# Patient Record
Sex: Female | Born: 1975 | Race: Black or African American | Hispanic: No | Marital: Married | Smoking: Never smoker
Health system: Southern US, Community
[De-identification: ages and names within clinical notes are randomized; demographics above are authoritative.]

## PROBLEM LIST (undated history)

## (undated) DIAGNOSIS — O24419 Gestational diabetes mellitus in pregnancy, unspecified control: Secondary | ICD-10-CM

## (undated) DIAGNOSIS — R51 Headache: Secondary | ICD-10-CM

## (undated) DIAGNOSIS — R519 Headache, unspecified: Secondary | ICD-10-CM

---

## 2014-06-16 ENCOUNTER — Encounter (HOSPITAL_COMMUNITY): Payer: Self-pay | Admitting: *Deleted

## 2014-06-16 ENCOUNTER — Emergency Department (HOSPITAL_COMMUNITY)
Admission: EM | Admit: 2014-06-16 | Discharge: 2014-06-16 | Disposition: A | Discharge status: Home / Self Care | Payer: Medicaid Other | Source: Home / Self Care | Attending: Emergency Medicine | Admitting: Emergency Medicine

## 2014-06-16 DIAGNOSIS — H109 Unspecified conjunctivitis: Secondary | ICD-10-CM | POA: Diagnosis not present

## 2014-06-16 DIAGNOSIS — J069 Acute upper respiratory infection, unspecified: Secondary | ICD-10-CM

## 2014-06-16 DIAGNOSIS — B9789 Other viral agents as the cause of diseases classified elsewhere: Principal | ICD-10-CM

## 2014-06-16 MED ORDER — CETIRIZINE HCL 10 MG PO TABS: 10.0000 mg | ORAL_TABLET | Freq: Every day | ORAL | Status: DC

## 2014-06-16 MED ORDER — ERYTHROMYCIN 5 MG/GM OP OINT: TOPICAL_OINTMENT | OPHTHALMIC | Status: DC

## 2014-06-16 NOTE — Discharge Instructions (Signed)
Congratulations on your pregnancy! You have a cold virus. It is safe to take cetirizine in pregnancy. This will help with the nasal congestion and runny nose. Use the erythromycin eye ointment twice a day in the left eye. This is also safe in pregnancy. Use nasal saline spray as often as needed to help with congestion. If you develop fevers, difficulty breathing, or are just not getting better in the next week, please come back.

## 2014-06-16 NOTE — ED Provider Notes (Signed)
CSN: [id number]     Arrival date & time 06/16/14  1659 History   First MD Initiated Contact with Patient 06/16/14 1803     Chief Complaint  Patient presents with  . Sore Throat  . Conjunctivitis  . Cough   (Consider location/radiation/quality/duration/timing/severity/associated sxs/prior Treatment) HPI  She is a 39 year old woman here for evaluation of cold symptoms. Her symptoms started on Sunday with itching and clear drainage from her left thigh. Her left eye is also red. Then, yesterday she developed nasal congestion, rhinorrhea, sore throat, laryngitis, cough. She denies any fevers. No shortness of breath. No nausea or vomiting. She has not tried any medications. She is [redacted] weeks pregnant. She has an initial OB appointment scheduled for the end of the month.  History reviewed. No pertinent past medical history. Past Surgical History  Procedure Laterality Date  . Cesarean section  2012, 2014     x 2   History reviewed. No pertinent family history. History  Substance Use Topics  . Smoking status: Never Smoker   . Smokeless tobacco: Not on file  . Alcohol Use: No   OB History    Gravida Para Term Preterm AB TAB SAB Ectopic Multiple Living   1              Review of Systems  Constitutional: Negative for fever.  HENT: Positive for congestion, rhinorrhea, sore throat and voice change. Negative for ear pain and trouble swallowing.   Eyes: Positive for discharge, redness and itching.  Respiratory: Positive for cough. Negative for shortness of breath.   Gastrointestinal: Negative for nausea and vomiting.    Allergies  Review of patient's allergies indicates no known allergies.  Home Medications   Prior to Admission medications   Medication Sig Start Date End Date Taking? Authorizing Provider  cetirizine (ZYRTEC) 10 MG tablet Take 1 tablet (10 mg total) by mouth daily. 06/16/14   Charm RingsErin J Dabney Schanz, MD  erythromycin ophthalmic ointment Place a 1/2 inch ribbon of ointment into the  left lower eyelid twice a day. 06/16/14   Charm RingsErin J Eman Rynders, MD   BP 102/73 mmHg  Pulse 87  Temp(Src) 98.1 F (36.7 C) (Oral)  Resp 14  SpO2 99%  LMP 04/06/2014 Physical Exam  Constitutional: She is oriented to person, place, and time. She appears well-developed and well-nourished. No distress.  HENT:  Right Ear: Tympanic membrane normal.  Left Ear: Tympanic membrane normal.  Nose: Mucosal edema and rhinorrhea present.  Mouth/Throat: Posterior oropharyngeal erythema present. No oropharyngeal exudate.  Eyes: Pupils are equal, round, and reactive to light. Right conjunctiva is not injected. Left conjunctiva is injected.  Neck: Neck supple.  Cardiovascular: Normal rate, regular rhythm and normal heart sounds.   No murmur heard. Pulmonary/Chest: Effort normal and breath sounds normal. No respiratory distress. She has no wheezes. She has no rales.  Lymphadenopathy:    She has no cervical adenopathy.  Neurological: She is alert and oriented to person, place, and time.    ED Course  Procedures (including critical care time) Labs Review Labs Reviewed - No data to display  Imaging Review No results found.   MDM   1. Viral URI with cough   2. Conjunctivitis of right eye    Recommended Zyrtec and nasal saline spray to help with congestion and rhinorrhea. Erythromycin eye ointment for conjunctivitis. Follow-up as needed.   Charm RingsErin J Jasmane Brockway, MD 06/16/14 [phone]

## 2014-06-16 NOTE — ED Notes (Addendum)
C/o sore throat onset yesterday with laryngitis and cough.  Pink eye OS onset Sunday.  Cough is prod. of yellow sputum.  No fever. States she is not taking any OTC meds because she is [redacted] weeks pregnant.

## 2014-07-23 LAB — OB RESULTS CONSOLE ABO/RH: RH Type: POSITIVE

## 2014-07-23 LAB — OB RESULTS CONSOLE HGB/HCT, BLOOD
HCT: 36 %
Hemoglobin: 11.6 g/dL

## 2014-07-23 LAB — DRUG SCREEN, URINE: DRUG SCREEN, URINE: NEGATIVE

## 2014-07-23 LAB — OB RESULTS CONSOLE PLATELET COUNT: PLATELETS: 227 10*3/uL

## 2014-07-23 LAB — OB RESULTS CONSOLE HEPATITIS B SURFACE ANTIGEN: Hepatitis B Surface Ag: NEGATIVE

## 2014-07-23 LAB — OB RESULTS CONSOLE RUBELLA ANTIBODY, IGM: Rubella: IMMUNE

## 2014-07-23 LAB — OB RESULTS CONSOLE ANTIBODY SCREEN: Antibody Screen: NEGATIVE

## 2014-07-23 LAB — OB RESULTS CONSOLE VARICELLA ZOSTER ANTIBODY, IGG: VARICELLA IGG: IMMUNE

## 2014-07-23 LAB — CULTURE, OB URINE: Urine Culture, OB: NEGATIVE

## 2014-07-23 LAB — GLUCOSE TOLERANCE, 1 HOUR (50G) W/O FASTING: Glucose 1 Hr Prenatal, POC: 137 mg/dL

## 2014-07-23 LAB — OB RESULTS CONSOLE RPR: RPR: NONREACTIVE

## 2014-07-23 LAB — CYTOLOGY - PAP: CYTOLOGY - PAP: NEGATIVE

## 2014-07-23 LAB — OB RESULTS CONSOLE GC/CHLAMYDIA
Chlamydia: NEGATIVE
GC PROBE AMP, GENITAL: NEGATIVE

## 2014-07-23 LAB — OB RESULTS CONSOLE HIV ANTIBODY (ROUTINE TESTING): HIV: NONREACTIVE

## 2014-07-23 LAB — CYSTIC FIBROSIS DIAGNOSTIC STUDY: Interpretation-CFDNA:: NEGATIVE

## 2014-07-31 ENCOUNTER — Other Ambulatory Visit (HOSPITAL_COMMUNITY): Payer: Self-pay | Admitting: Nurse Practitioner

## 2014-07-31 DIAGNOSIS — Z3689 Encounter for other specified antenatal screening: Secondary | ICD-10-CM

## 2014-07-31 LAB — GLUCOSE, 3 HOUR GESTATIONAL
GLUCOSE 2 HOUR GTT: 145 mg/dL — AB (ref ?–140)
GLUCOSE 3 HOUR GTT: 119 mg/dL (ref ?–140)
Glucose, Fasting: 96 mg/dL (ref 60–109)
Glucose, GTT - 1 Hour: 154 mg/dL (ref ?–200)

## 2014-09-01 ENCOUNTER — Ambulatory Visit (HOSPITAL_COMMUNITY)
Admission: RE | Admit: 2014-09-01 | Discharge: 2014-09-01 | Disposition: A | Discharge status: Home / Self Care | Payer: Medicaid Other | Source: Ambulatory Visit | Attending: Nurse Practitioner | Admitting: Nurse Practitioner

## 2014-09-01 ENCOUNTER — Other Ambulatory Visit (HOSPITAL_COMMUNITY): Payer: Self-pay | Admitting: Nurse Practitioner

## 2014-09-01 ENCOUNTER — Ambulatory Visit (HOSPITAL_COMMUNITY): Admission: RE | Admit: 2014-09-01 | Payer: Medicaid Other | Source: Ambulatory Visit

## 2014-09-01 DIAGNOSIS — O3421 Maternal care for scar from previous cesarean delivery: Secondary | ICD-10-CM | POA: Insufficient documentation

## 2014-09-01 DIAGNOSIS — Z3A19 19 weeks gestation of pregnancy: Secondary | ICD-10-CM | POA: Insufficient documentation

## 2014-09-01 DIAGNOSIS — Z3689 Encounter for other specified antenatal screening: Secondary | ICD-10-CM

## 2014-09-01 DIAGNOSIS — O09529 Supervision of elderly multigravida, unspecified trimester: Secondary | ICD-10-CM | POA: Insufficient documentation

## 2014-09-01 DIAGNOSIS — Z36 Encounter for antenatal screening of mother: Secondary | ICD-10-CM | POA: Insufficient documentation

## 2014-09-01 DIAGNOSIS — O09522 Supervision of elderly multigravida, second trimester: Secondary | ICD-10-CM | POA: Insufficient documentation

## 2014-09-03 LAB — AFP MATERNAL TRIPLE SCREEN: AFP, SERUM MAT SCREEN: NEGATIVE

## 2014-09-08 ENCOUNTER — Other Ambulatory Visit (HOSPITAL_COMMUNITY): Payer: Self-pay | Admitting: Physician Assistant

## 2014-09-08 DIAGNOSIS — Z3689 Encounter for other specified antenatal screening: Secondary | ICD-10-CM

## 2014-10-13 ENCOUNTER — Other Ambulatory Visit (HOSPITAL_COMMUNITY): Payer: Self-pay | Admitting: Physician Assistant

## 2014-10-13 ENCOUNTER — Ambulatory Visit (HOSPITAL_COMMUNITY)
Admission: RE | Admit: 2014-10-13 | Discharge: 2014-10-13 | Disposition: A | Discharge status: Home / Self Care | Payer: Medicaid Other | Source: Ambulatory Visit | Attending: Physician Assistant | Admitting: Physician Assistant

## 2014-10-13 DIAGNOSIS — O4402 Placenta previa specified as without hemorrhage, second trimester: Secondary | ICD-10-CM

## 2014-10-13 DIAGNOSIS — O34219 Maternal care for unspecified type scar from previous cesarean delivery: Secondary | ICD-10-CM

## 2014-10-13 DIAGNOSIS — O09522 Supervision of elderly multigravida, second trimester: Secondary | ICD-10-CM

## 2014-10-13 DIAGNOSIS — Z36 Encounter for antenatal screening of mother: Secondary | ICD-10-CM | POA: Insufficient documentation

## 2014-10-13 DIAGNOSIS — Z3689 Encounter for other specified antenatal screening: Secondary | ICD-10-CM

## 2014-10-13 DIAGNOSIS — Z3A25 25 weeks gestation of pregnancy: Secondary | ICD-10-CM

## 2014-10-13 DIAGNOSIS — O3421 Maternal care for scar from previous cesarean delivery: Secondary | ICD-10-CM | POA: Insufficient documentation

## 2014-10-23 ENCOUNTER — Other Ambulatory Visit (HOSPITAL_COMMUNITY): Payer: Self-pay | Admitting: Nurse Practitioner

## 2014-10-23 DIAGNOSIS — Z3A28 28 weeks gestation of pregnancy: Secondary | ICD-10-CM

## 2014-10-23 DIAGNOSIS — Z3A36 36 weeks gestation of pregnancy: Secondary | ICD-10-CM

## 2014-10-23 DIAGNOSIS — O4420 Partial placenta previa NOS or without hemorrhage, unspecified trimester: Secondary | ICD-10-CM

## 2014-10-23 DIAGNOSIS — O09523 Supervision of elderly multigravida, third trimester: Secondary | ICD-10-CM

## 2014-10-23 DIAGNOSIS — Z3A39 39 weeks gestation of pregnancy: Secondary | ICD-10-CM

## 2014-10-23 DIAGNOSIS — Z3A32 32 weeks gestation of pregnancy: Secondary | ICD-10-CM

## 2014-10-23 DIAGNOSIS — IMO0002 Reserved for concepts with insufficient information to code with codable children: Secondary | ICD-10-CM

## 2014-10-23 DIAGNOSIS — Z3A37 37 weeks gestation of pregnancy: Secondary | ICD-10-CM

## 2014-10-23 DIAGNOSIS — Z3A38 38 weeks gestation of pregnancy: Secondary | ICD-10-CM

## 2014-11-04 ENCOUNTER — Ambulatory Visit (HOSPITAL_COMMUNITY)
Admission: RE | Admit: 2014-11-04 | Discharge: 2014-11-04 | Disposition: A | Discharge status: Home / Self Care | Payer: Medicaid Other | Source: Ambulatory Visit | Attending: Nurse Practitioner | Admitting: Nurse Practitioner

## 2014-11-04 DIAGNOSIS — O09523 Supervision of elderly multigravida, third trimester: Secondary | ICD-10-CM

## 2014-11-04 DIAGNOSIS — Z3A28 28 weeks gestation of pregnancy: Secondary | ICD-10-CM | POA: Diagnosis not present

## 2014-11-04 DIAGNOSIS — IMO0002 Reserved for concepts with insufficient information to code with codable children: Secondary | ICD-10-CM

## 2014-11-04 DIAGNOSIS — O4413 Placenta previa with hemorrhage, third trimester: Secondary | ICD-10-CM | POA: Insufficient documentation

## 2014-11-04 DIAGNOSIS — O4420 Partial placenta previa NOS or without hemorrhage, unspecified trimester: Secondary | ICD-10-CM

## 2014-11-05 LAB — OB RESULTS CONSOLE HGB/HCT, BLOOD
HEMATOCRIT: 36 %
Hemoglobin: 11.1 g/dL

## 2014-11-05 LAB — GLUCOSE, 3 HOUR GESTATIONAL
GLUCOSE FASTING: 100 mg/dL (ref 60–109)
Glucose, GTT - 1 Hour: 183 mg/dL (ref ?–200)
Glucose, GTT - 2 Hour: 176 mg/dL — AB (ref ?–140)
Glucose, GTT - 3 Hour: 117 mg/dL (ref ?–140)

## 2014-11-05 LAB — OB RESULTS CONSOLE RPR: RPR: NONREACTIVE

## 2014-11-09 ENCOUNTER — Encounter: Payer: Self-pay | Admitting: *Deleted

## 2014-11-09 ENCOUNTER — Ambulatory Visit: Payer: Medicaid Other | Admitting: *Deleted

## 2014-11-09 ENCOUNTER — Encounter: Payer: Medicaid Other | Attending: Obstetrics and Gynecology | Admitting: *Deleted

## 2014-11-09 VITALS — Ht 61.75 in | Wt 187.9 lb

## 2014-11-09 DIAGNOSIS — O24419 Gestational diabetes mellitus in pregnancy, unspecified control: Secondary | ICD-10-CM | POA: Insufficient documentation

## 2014-11-09 DIAGNOSIS — E669 Obesity, unspecified: Secondary | ICD-10-CM | POA: Insufficient documentation

## 2014-11-09 DIAGNOSIS — IMO0002 Reserved for concepts with insufficient information to code with codable children: Secondary | ICD-10-CM | POA: Insufficient documentation

## 2014-11-09 DIAGNOSIS — O4400 Placenta previa specified as without hemorrhage, unspecified trimester: Secondary | ICD-10-CM | POA: Insufficient documentation

## 2014-11-09 DIAGNOSIS — Z713 Dietary counseling and surveillance: Secondary | ICD-10-CM | POA: Insufficient documentation

## 2014-11-09 DIAGNOSIS — Z98891 History of uterine scar from previous surgery: Secondary | ICD-10-CM

## 2014-11-09 MED ORDER — GLUCOSE BLOOD VI STRP: 1.0000 | ORAL_STRIP | Freq: Four times a day (QID) | Status: AC

## 2014-11-09 MED ORDER — ACCU-CHEK FASTCLIX LANCETS MISC: 1.0000 | Freq: Four times a day (QID) | Status: AC

## 2014-11-09 NOTE — Progress Notes (Addendum)
Nutrition note: GDM diet education Pt was recently diagnosed with GDM & has h/o obesity. Pt has gained 9.9# @ [redacted]w[redacted]d (per [facility] EDC of 01/24/15), which is wnl. Pt reports eating 3 meals & 2 snacks/d. Pt reports no N&V but has heartburn occ. NKFA. Pt reports walking 30 mins 2-3x/wk. Pt received verbal & written education about GDM diet. Encouraged walking ~30 mins/d. Discussed wt gain goals of 11-20# or 0.5#/wk. Pt agrees to follow GDM diet with 3 meals & 1-3 snacks/d with proper CHO/ protein combination. Pt has WIC & plans to BF. F/u in 2-4 wks Blondell Reveal, MS, RD, LDN, [facility]

## 2014-11-09 NOTE — Progress Notes (Signed)
  Patient was seen on 11/09/14 for Gestational Diabetes self-management . The following learning objectives were met by the patient :   States the definition of Gestational Diabetes  tates when to check blood glucose levels  Demonstrates proper blood glucose monitoring techniques  States the effect of stress and exercise on blood glucose levels  States the importance of limiting caffeine and abstaining from alcohol and smoking  Plan:  Begin reading food labels for Total Carbohydrate and sugar grams of foods Consider  increasing your activity level by walking daily as tolerated Begin checking BG before breakfast and 2 hours after first bit of breakfast, lunch and dinner after  as directed by MD  Take medication  as directed by MD  Blood glucose monitor given: accu-chek [employer] connect Lot # [id number] Exp: 07/27/15 Blood glucose reading: 136 57mn pp  Patient instructed to monitor glucose levels: FBS: 60 - <90 1 hour: <140 2 hour: <120  Patient received the following handouts:  Nutrition Diabetes and Pregnancy  Carbohydrate Counting List  Meal Planning worksheet  Patient will be seen for follow-up as needed.

## 2014-11-10 ENCOUNTER — Inpatient Hospital Stay (HOSPITAL_COMMUNITY)
Admission: AD | Admit: 2014-11-10 | Discharge: 2014-11-16 | DRG: 781 | Disposition: A | Discharge status: Other Acute Inpatient Hospital | Payer: Medicaid Other | Source: Ambulatory Visit | Attending: Family Medicine | Admitting: Family Medicine

## 2014-11-10 ENCOUNTER — Encounter (HOSPITAL_COMMUNITY): Payer: Self-pay

## 2014-11-10 DIAGNOSIS — O4413 Placenta previa with hemorrhage, third trimester: Secondary | ICD-10-CM | POA: Diagnosis present

## 2014-11-10 DIAGNOSIS — O4690 Antepartum hemorrhage, unspecified, unspecified trimester: Secondary | ICD-10-CM | POA: Diagnosis not present

## 2014-11-10 DIAGNOSIS — O43213 Placenta accreta, third trimester: Secondary | ICD-10-CM | POA: Diagnosis present

## 2014-11-10 DIAGNOSIS — O3421 Maternal care for scar from previous cesarean delivery: Secondary | ICD-10-CM | POA: Diagnosis present

## 2014-11-10 DIAGNOSIS — O3663X0 Maternal care for excessive fetal growth, third trimester, not applicable or unspecified: Secondary | ICD-10-CM | POA: Diagnosis present

## 2014-11-10 DIAGNOSIS — O34219 Maternal care for unspecified type scar from previous cesarean delivery: Secondary | ICD-10-CM

## 2014-11-10 DIAGNOSIS — O24419 Gestational diabetes mellitus in pregnancy, unspecified control: Secondary | ICD-10-CM | POA: Diagnosis present

## 2014-11-10 DIAGNOSIS — O4693 Antepartum hemorrhage, unspecified, third trimester: Secondary | ICD-10-CM

## 2014-11-10 DIAGNOSIS — O09523 Supervision of elderly multigravida, third trimester: Secondary | ICD-10-CM

## 2014-11-10 DIAGNOSIS — Z3A29 29 weeks gestation of pregnancy: Secondary | ICD-10-CM | POA: Diagnosis present

## 2014-11-10 DIAGNOSIS — Z98891 History of uterine scar from previous surgery: Secondary | ICD-10-CM

## 2014-11-10 DIAGNOSIS — O24410 Gestational diabetes mellitus in pregnancy, diet controlled: Secondary | ICD-10-CM | POA: Diagnosis present

## 2014-11-10 DIAGNOSIS — O4410 Placenta previa with hemorrhage, unspecified trimester: Secondary | ICD-10-CM

## 2014-11-10 DIAGNOSIS — Z9889 Other specified postprocedural states: Secondary | ICD-10-CM | POA: Diagnosis not present

## 2014-11-10 HISTORY — DX: Headache: R51

## 2014-11-10 HISTORY — DX: Gestational diabetes mellitus in pregnancy, unspecified control: O24.419

## 2014-11-10 HISTORY — DX: Headache, unspecified: R51.9

## 2014-11-10 LAB — URINALYSIS, ROUTINE W REFLEX MICROSCOPIC
BILIRUBIN URINE: NEGATIVE
GLUCOSE, UA: NEGATIVE mg/dL
KETONES UR: 15 mg/dL — AB
Leukocytes, UA: NEGATIVE
NITRITE: NEGATIVE
PH: 7 (ref 5.0–8.0)
Protein, ur: NEGATIVE mg/dL
Specific Gravity, Urine: 1.015 (ref 1.005–1.030)
Urobilinogen, UA: 0.2 mg/dL (ref 0.0–1.0)

## 2014-11-10 LAB — CBC
HCT: 31.2 % — ABNORMAL LOW (ref 36.0–46.0)
HEMOGLOBIN: 10.4 g/dL — AB (ref 12.0–15.0)
MCH: 28.2 pg (ref 26.0–34.0)
MCHC: 33.3 g/dL (ref 30.0–36.0)
MCV: 84.6 fL (ref 78.0–100.0)
Platelets: 181 10*3/uL (ref 150–400)
RBC: 3.69 MIL/uL — AB (ref 3.87–5.11)
RDW: 14.1 % (ref 11.5–15.5)
WBC: 8.4 10*3/uL (ref 4.0–10.5)

## 2014-11-10 LAB — URINE MICROSCOPIC-ADD ON

## 2014-11-10 LAB — GLUCOSE, CAPILLARY
GLUCOSE-CAPILLARY: 138 mg/dL — AB (ref 65–99)
Glucose-Capillary: 139 mg/dL — ABNORMAL HIGH (ref 65–99)

## 2014-11-10 LAB — TYPE AND SCREEN
ABO/RH(D): A POS
ANTIBODY SCREEN: NEGATIVE

## 2014-11-10 LAB — WET PREP, GENITAL
Clue Cells Wet Prep HPF POC: NONE SEEN
Trich, Wet Prep: NONE SEEN

## 2014-11-10 LAB — ABO/RH: ABO/RH(D): A POS

## 2014-11-10 LAB — GROUP B STREP BY PCR: GROUP B STREP BY PCR: POSITIVE — AB

## 2014-11-10 LAB — OB RESULTS CONSOLE GBS: GBS: POSITIVE

## 2014-11-10 MED ORDER — PRENATAL MULTIVITAMIN CH
1.0000 | ORAL_TABLET | Freq: Every day | ORAL | Status: DC
  Administered 2014-11-12 – 2014-11-15 (×4): 1 via ORAL
  Filled 2014-11-10 (×7): qty 1

## 2014-11-10 MED ORDER — BETAMETHASONE SOD PHOS & ACET 6 (3-3) MG/ML IJ SUSP
12.0000 mg | INTRAMUSCULAR | Status: AC
  Administered 2014-11-10 – 2014-11-11 (×2): 12 mg via INTRAMUSCULAR
  Filled 2014-11-10 (×2): qty 2

## 2014-11-10 MED ORDER — CALCIUM CARBONATE ANTACID 500 MG PO CHEW
2.0000 | CHEWABLE_TABLET | ORAL | Status: DC | PRN
  Filled 2014-11-10: qty 2

## 2014-11-10 MED ORDER — ZOLPIDEM TARTRATE 5 MG PO TABS: 5.0000 mg | ORAL_TABLET | Freq: Every evening | ORAL | Status: DC | PRN

## 2014-11-10 MED ORDER — ACETAMINOPHEN 325 MG PO TABS: 650.0000 mg | ORAL_TABLET | ORAL | Status: DC | PRN

## 2014-11-10 MED ORDER — DOCUSATE SODIUM 100 MG PO CAPS
100.0000 mg | ORAL_CAPSULE | Freq: Every day | ORAL | Status: DC
Start: 2014-11-10 — End: 2014-11-16
  Administered 2014-11-11 – 2014-11-15 (×5): 100 mg via ORAL
  Filled 2014-11-10 (×7): qty 1

## 2014-11-10 NOTE — MAU Note (Signed)
Pt. States a small amount of bleeding started at 0620 while at work.  There is less now.  Pt. Complains of mild cramping.

## 2014-11-10 NOTE — H&P (Signed)
ANTEPARTUM ADMISSION HISTORY AND PHYSICAL NOTE   History of Present Illness: Haley Moses is a 39 y.o. [id number] at [redacted]w[redacted]d admitted for vaginal bleeding.  Patient with history of placenta previa this pregnancy; on most recent u/s (9/7) placenta was marginal, 7 mm from internal os.  Yesterday patient had sex. This morning she noticed moderate vaginal bleeding. Had some mild cramps that have now stopped. No contractions. Positive fetal movement. No abdominal pain. No dysuria or hematuria or increased frequency or fever or nausea/vomiting or back pain. No increased vaginal discharge, no vaginal pain or burning.   Patient Active Problem List   Diagnosis Date Noted  . Vaginal bleeding in pregnancy 11/10/2014  . Vaginal bleeding during pregnancy, antepartum 11/10/2014  . LGA (large for gestational age) fetus 11/09/2014  . H/O: cesarean section 11/09/2014  . Obesity 11/09/2014  . Placenta previa 11/09/2014  . Encounter for fetal anatomic survey   . Advanced maternal age in multigravida   . [redacted] weeks gestation of pregnancy     Past Medical History  Diagnosis Date  . Cancer     Past Surgical History  Procedure Laterality Date  . Cesarean section  2012, 2014     x 2    OB History  Gravida Para Term Preterm AB SAB TAB Ectopic Multiple Living  4 2 2  0 1 1 0 0 0 2    # Outcome Date GA Lbr Len/2nd Weight Sex Delivery Anes PTL Lv  4 Current           3 Term 06/22/12 [redacted]w[redacted]d  9 lb 2 oz (4.139 kg) F   N Y  2 SAB 06/28/11          1 Term 03/30/10 [redacted]w[redacted]d  8 lb 10 oz (3.912 kg) F    Y      Social History   Social History  . Marital Status: Married    Spouse Name: N/A  . Number of Children: N/A  . Years of Education: N/A   Social History Main Topics  . Smoking status: Never Smoker   . Smokeless tobacco: None  . Alcohol Use: No  . Drug Use: No  . Sexual Activity: Yes    Birth Control/ Protection: None   Other Topics Concern  . None   Social History Narrative    History  reviewed. No pertinent family history.  No Known Allergies  Prescriptions prior to admission  Medication Sig Dispense Refill Last Dose  . Prenatal Vit-Fe Fumarate-FA (PRENATAL MULTIVITAMIN) TABS tablet Take 1 tablet by mouth daily.   11/09/2014 at Unknown time  . ACCU-CHEK FASTCLIX LANCETS MISC 1 each by Percutaneous route 4 (four) times daily. For testing 4 times daily. DX GDM O24.419 100 each 12   . cetirizine (ZYRTEC) 10 MG tablet Take 1 tablet (10 mg total) by mouth daily. (Patient not taking: Reported on 11/10/2014) 30 tablet 0 Not Taking at Unknown time  . erythromycin ophthalmic ointment Place a 1/2 inch ribbon of ointment into the left lower eyelid twice a day. (Patient not taking: Reported on 11/10/2014) 3.5 g 0 Not Taking at Unknown time  . glucose blood (ACCU-CHEK AVIVA) test strip 1 each by Other route 4 (four) times daily. For testing 4 times daily, DX GDM O24.419 100 each 12     Review of Systems - 10-point ROS negative except as described above.  Vitals:  BP 83/41 mmHg  Pulse 96  Temp(Src) 98.9 F (37.2 C) (Oral)  Resp 16  SpO2 100%  LMP  04/06/2014 Physical Examination: CONSTITUTIONAL: Well-developed, well-nourished female in no acute distress.  HENT:  Normocephalic, atraumatic, External right and left ear normal. Oropharynx is clear and moist EYES: Conjunctivae and EOM are normal. Pupils are equal, round, and reactive to light. No scleral icterus.  NECK: Normal range of motion, supple, no masses SKIN: Skin is warm and dry. No rash noted. Not diaphoretic. No erythema. No pallor. NEUROLGIC: Alert and oriented to person, place, and time. Normal reflexes, muscle tone coordination. No cranial nerve deficit noted. PSYCHIATRIC: Normal mood and affect. Normal behavior. Normal judgment and thought content. CARDIOVASCULAR: Normal heart rate noted, regular rhythm RESPIRATORY: Effort and breath sounds normal, no problems with respiration noted ABDOMEN: Soft, nontender, nondistended,  gravid. MUSCULOSKELETAL: Normal range of motion. No edema and no tenderness. 2+ distal pulses. Cervix: visually closed, small blood at os mixed with mucous, no cervical lesions Membranes:intact Fetal Monitoring: 155/mod/+a/-3 Tocometer: quiet  Labs:  Results for orders placed or performed during the hospital encounter of 11/10/14 (from the past 24 hour(s))  Wet prep, genital   Collection Time: 11/10/14  8:55 AM  Result Value Ref Range   Yeast Wet Prep HPF POC FEW (A) NONE SEEN   Trich, Wet Prep NONE SEEN NONE SEEN   Clue Cells Wet Prep HPF POC NONE SEEN NONE SEEN   WBC, Wet Prep HPF POC FEW (A) NONE SEEN    Imaging Studies: [place] [facility] Ob Transvaginal  11/01/2014   OBSTETRICAL ULTRASOUND: This exam was performed within a [facility]. The OB US report was generated in the AS system, and faxed to the ordering physician.   This report is available in the [employer]. See the AS Obstetric US report via the Image Link.  [place] [facility] Ob Follow Up  11/04/2014   OBSTETRICAL ULTRASOUND: This exam was performed within a [place] [facility]. The OB US report was generated in the AS system, and faxed to the ordering physician.   This report is available in the [employer]. See the AS Obstetric US report via the Image Link.  [place] Mfm Ob Limited  11/01/14   OBSTETRICAL ULTRASOUND: This exam was performed within a [place] [facility]. The OB US report was generated in the AS system, and faxed to the ordering physician.   This report is available in the [employer]. See the AS Obstetric US report via the Image Link.    Assessment and Plan: Patient Active Problem List   Diagnosis Date Noted  . Vaginal bleeding in pregnancy 11/10/2014  . Vaginal bleeding during pregnancy, antepartum 11/10/2014  . LGA (large for gestational age) fetus 11/09/2014  . H/O: cesarean section 11/09/2014  . Obesity 11/09/2014  . Placenta previa 11/09/2014  . Encounter for fetal  anatomic survey   . Advanced maternal age in multigravida   . [redacted] weeks gestation of pregnancy    39 yo [id number] @ 29+2, history c/s x2 and low-lying placenta, presenting with moderate vaginal bleeding. Likely 2/2 known low-lying placenta; initial cramping concerning for abruption, but currently no uterine tenderness and category 1 FHT. Do not think patient in preterm labor: cervix visually closed, is not contracting. Rh positive.  Plan: #Vaginal Bleeding: Likely 2/2 to low-lying placenta and sexual activity - admit to antepartum for monitoring - CBC in AM to assure not decreasing - Type&Screen - wet prep - few WBC, few yeast - UA- large hemoglobin and keytones, not infectious appearing - f/u chlamydia - Continuous fetal monitoring  #FWB: Preterm infant.  -  GBS rapid PCR- positive. If laboring will need PCN but likely will necessitate repeat section - BMZ 12 mg IM q 24 for 2 doses, first dose to be given now - If labor imminent would start magnesium for ICH prophylaxis  #A1GDM:  2hr pp 138 today. New diagnosis today.  - QID CBGs - Diabetes education outpatient - Carb consistent diet  #h/o CS x2- planned repeat LTCS    Tarri Abernethy, MD PGY-1 [facility]  Faculty Practice, [facility] [place]   OB fellow attestation: I have seen and examined this patient; I agree with above documentation in the resident's note.  Haley Moses is a 39 y.o. [id number] here for vaginal bleeding in the setting of marginal placenta 7mm from cervical os.  PE: BP 88/48 mmHg  Pulse 93  Temp(Src) 98.9 F (37.2 C) (Oral)  Resp 16  SpO2 100%  LMP 04/06/2014 Gen: calm comfortable, NAD Resp: normal effort, no distress Abd: gravid GU: did not repeat exam.   A/P: agree with above and edited accordingly. Briefly- needs admit for monitoring and likely will need to stay at least 72 hours after her last bleed. For lung maturity giving BMZ.   Federico Flake,  MD Family Medicine, OB Fellow 11/10/2014, 12:14 PM

## 2014-11-10 NOTE — Progress Notes (Signed)
Very difficult to monitor. Patient turned to L side and cardio placed on far R.

## 2014-11-11 ENCOUNTER — Inpatient Hospital Stay (HOSPITAL_COMMUNITY): Payer: Medicaid Other

## 2014-11-11 DIAGNOSIS — O4690 Antepartum hemorrhage, unspecified, unspecified trimester: Secondary | ICD-10-CM

## 2014-11-11 LAB — CBC
HEMATOCRIT: 30.7 % — AB (ref 36.0–46.0)
Hemoglobin: 10.2 g/dL — ABNORMAL LOW (ref 12.0–15.0)
MCH: 28.3 pg (ref 26.0–34.0)
MCHC: 33.2 g/dL (ref 30.0–36.0)
MCV: 85 fL (ref 78.0–100.0)
PLATELETS: 202 10*3/uL (ref 150–400)
RBC: 3.61 MIL/uL — AB (ref 3.87–5.11)
RDW: 14.2 % (ref 11.5–15.5)
WBC: 8 10*3/uL (ref 4.0–10.5)

## 2014-11-11 LAB — GLUCOSE, CAPILLARY
GLUCOSE-CAPILLARY: 128 mg/dL — AB (ref 65–99)
GLUCOSE-CAPILLARY: 131 mg/dL — AB (ref 65–99)
GLUCOSE-CAPILLARY: 182 mg/dL — AB (ref 65–99)
Glucose-Capillary: 144 mg/dL — ABNORMAL HIGH (ref 65–99)

## 2014-11-11 LAB — GC/CHLAMYDIA PROBE AMP (~~LOC~~) NOT AT ARMC
Chlamydia: NEGATIVE
Neisseria Gonorrhea: NEGATIVE

## 2014-11-11 NOTE — Progress Notes (Signed)
Initial Nutrition Assessment  DOCUMENTATION CODES:   Obesity unspecified  INTERVENTION:  Carbohydrate modified gestational Diabetic diet   NUTRITION DIAGNOSIS:   Increased nutrient needs related to  (pregnancy and fetal growth requirements) as evidenced by  ([redacted] weeks gestation).  GOAL:   Patient will meet greater than or equal to 90% of their needs   MONITOR:   Labs, Weight trends  REASON FOR ASSESSMENT:   Gestational Diabetes, Antenatal    ASSESSMENT:  Pre-pregnancy weight 177 lbs, 9 lb weight gain. Pre-pregnancy BMI 32.4  Goal weight gain 11-20 lbs     Diet Order:  Diet gestational carb mod Room service appropriate?: Yes; Fluid consistency:: Thin Last BM:     Height:   Ht Readings from Last 1 Encounters:  11/10/14  (1.575 m)    Weight:   Wt Readings from Last 1 Encounters:  11/11/14 186 lb 11.2 oz (84.687 kg)    Ideal Body Weight:  50 kg  BMI:  Body mass index is 34.14 kg/(m^2).  Estimated Nutritional Needs:   Kcal:  1950-2150  Protein:  83-93 g  Fluid:  2.3 L  EDUCATION NEEDS: Education completed in [facility] on 9/12 ( GDM diet)    Elisabeth Cara M.Odis Luster LDN Neonatal Nutrition Support Specialist/RD III Pager [phone]      Phone [phone]

## 2014-11-11 NOTE — Progress Notes (Signed)
FACULTY PRACTICE ANTEPARTUM(COMPREHENSIVE) NOTE  Haley Moses is a 39 y.o. [id number] at [redacted]w[redacted]d by early ultrasound who is admitted for vaginal bleeding.   Fetal presentation is unsure. Length of Stay:  1  Days  Subjective:  Patient reports the fetal movement as active. Patient reports uterine contraction  activity as none. Patient reports  vaginal bleeding as less flow than a normal period. Patient describes fluid per vagina as None.  Vitals:  Blood pressure 109/51, pulse 102, temperature 98.7 F (37.1 C), temperature source Oral, resp. rate 16, height  (1.575 m), weight 84.823 kg (187 lb), last menstrual period 04/06/2014, SpO2 96 %. Physical Examination:  General appearance - alert, well appearing, and in no distress Heart - normal rate and regular rhythm Abdomen - soft, nontender, nondistended Fundal Height:  size equals dates Cervical Exam: Not evaluated.  Extremities: extremities normal, atraumatic, no cyanosis or edema and Homans sign is negative, no sign of DVT Membranes:intact  Fetal Monitoring:     Fetal Heart Rate A      Mode  External filed at 11/11/2014 1914    Baseline Rate (A)  130 bpm filed at 11/11/2014 7829    Variability  6-25 BPM filed at 11/11/2014 0705    Accelerations  15 x 15 filed at 11/11/2014 5621    Decelerations  None filed at 11/11/2014 0705        Labs:  Results for orders placed or performed during the hospital encounter of 11/10/14 (from the past 24 hour(s))  Wet prep, genital   Collection Time: 11/10/14  8:55 AM  Result Value Ref Range   Yeast Wet Prep HPF POC FEW (A) NONE SEEN   Trich, Wet Prep NONE SEEN NONE SEEN   Clue Cells Wet Prep HPF POC NONE SEEN NONE SEEN   WBC, Wet Prep HPF POC FEW (A) NONE SEEN  Group B strep by PCR   Collection Time: 11/10/14  8:55 AM  Result Value Ref Range   Group B strep by PCR POSITIVE (A) NEGATIVE  Type and screen   Collection Time: 11/10/14 10:05 AM  Result Value Ref Range   ABO/RH(D) A POS    Antibody Screen NEG    Sample Expiration 11/13/2014   ABO/Rh   Collection Time: 11/10/14 10:05 AM  Result Value Ref Range   ABO/RH(D) A POS   Urinalysis, Routine w reflex microscopic (not at [facility])   Collection Time: 11/10/14 11:20 AM  Result Value Ref Range   Color, Urine YELLOW YELLOW   APPearance CLEAR CLEAR   Specific Gravity, Urine 1.015 1.005 - 1.030   pH 7.0 5.0 - 8.0   Glucose, UA NEGATIVE NEGATIVE mg/dL   Hgb urine dipstick LARGE (A) NEGATIVE   Bilirubin Urine NEGATIVE NEGATIVE   Ketones, ur 15 (A) NEGATIVE mg/dL   Protein, ur NEGATIVE NEGATIVE mg/dL   Urobilinogen, UA 0.2 0.0 - 1.0 mg/dL   Nitrite NEGATIVE NEGATIVE   Leukocytes, UA NEGATIVE NEGATIVE  Urine microscopic-add on   Collection Time: 11/10/14 11:20 AM  Result Value Ref Range   Squamous Epithelial / LPF FEW (A) RARE   RBC / HPF 11-20 <3 RBC/hpf   Bacteria, UA RARE RARE  CBC   Collection Time: 11/10/14  6:36 PM  Result Value Ref Range   WBC 8.4 4.0 - 10.5 K/uL   RBC 3.69 (L) 3.87 - 5.11 MIL/uL   Hemoglobin 10.4 (L) 12.0 - 15.0 g/dL   HCT 30.8 (L) 65.7 - 84.6 %   MCV 84.6 78.0 -  100.0 fL   MCH 28.2 26.0 - 34.0 pg   MCHC 33.3 30.0 - 36.0 g/dL   RDW 16.1 09.6 - 04.5 %   Platelets 181 150 - 400 K/uL  Glucose, capillary   Collection Time: 11/10/14  6:48 PM  Result Value Ref Range   Glucose-Capillary 138 (H) 65 - 99 mg/dL   Comment 1 Notify RN    Comment 2 Document in Chart   Glucose, capillary   Collection Time: 11/10/14 10:42 PM  Result Value Ref Range   Glucose-Capillary 139 (H) 65 - 99 mg/dL  CBC   Collection Time: 11/11/14  5:30 AM  Result Value Ref Range   WBC 8.0 4.0 - 10.5 K/uL   RBC 3.61 (L) 3.87 - 5.11 MIL/uL   Hemoglobin 10.2 (L) 12.0 - 15.0 g/dL   HCT 40.9 (L) 81.1 - 91.4 %   MCV 85.0 78.0 - 100.0 fL   MCH 28.3 26.0 - 34.0 pg   MCHC 33.2 30.0 - 36.0 g/dL   RDW 78.2 95.6 - 21.3 %   Platelets 202 150 - 400 K/uL      Medications:  Scheduled . betamethasone  acetate-betamethasone sodium phosphate  12 mg Intramuscular Q24H  . docusate sodium  100 mg Oral Daily  . prenatal multivitamin  1 tablet Oral Q1200   I have reviewed the patient's current medications.  ASSESSMENT: Patient Active Problem List   Diagnosis Date Noted  . Vaginal bleeding in pregnancy 11/10/2014  . Vaginal bleeding during pregnancy, antepartum 11/10/2014  . LGA (large for gestational age) fetus 11/09/2014  . H/O: cesarean section 11/09/2014  . Obesity 11/09/2014  . Placenta previa 11/09/2014  . Encounter for fetal anatomic survey   . Advanced maternal age in multigravida   . [redacted] weeks gestation of pregnancy     PLAN: Second dose of betamethasone and f/u to check placenta  Haley Moses 11/11/2014,7:57 AM

## 2014-11-12 ENCOUNTER — Inpatient Hospital Stay (HOSPITAL_COMMUNITY)
Admit: 2014-11-12 | Discharge: 2014-11-12 | Disposition: A | Discharge status: Home / Self Care | Payer: Medicaid Other | Attending: Family Medicine | Admitting: Family Medicine

## 2014-11-12 LAB — GLUCOSE, CAPILLARY
GLUCOSE-CAPILLARY: 117 mg/dL — AB (ref 65–99)
Glucose-Capillary: 123 mg/dL — ABNORMAL HIGH (ref 65–99)
Glucose-Capillary: 113 mg/dL — ABNORMAL HIGH (ref 65–99)
Glucose-Capillary: 119 mg/dL — ABNORMAL HIGH (ref 65–99)

## 2014-11-12 LAB — GC/CHLAMYDIA PROBE AMP (~~LOC~~) NOT AT ARMC
CHLAMYDIA, DNA PROBE: NEGATIVE
Neisseria Gonorrhea: NEGATIVE

## 2014-11-12 MED ORDER — LACTATED RINGERS IV SOLN
INTRAVENOUS | Status: DC
  Administered 2014-11-12 – 2014-11-16 (×2): via INTRAVENOUS

## 2014-11-12 NOTE — Progress Notes (Signed)
Patient ID: Haley Moses, female   DOB: [birth year], 39 y.o.   MRN: [id number] FACULTY PRACTICE ANTEPARTUM(COMPREHENSIVE) NOTE  Haley Moses is a 39 y.o. [id number] at [redacted]w[redacted]d Estimated Date of Delivery: 01/24/15 by early ultrasound who is admitted for vaginal bleeding.   Fetal presentation is unsure. No bleeding since last evening Length of Stay:  2  Days  Subjective:  Patient reports the fetal movement as active. Patient reports uterine contraction  activity as none. Patient reports  vaginal bleeding as less flow than a normal period. Patient describes fluid per vagina as None.  Vitals:  Blood pressure 95/48, pulse 111, temperature 98 F (36.7 C), temperature source Oral, resp. rate 18, height  (1.575 m), weight 186 lb 11.2 oz (84.687 kg), last menstrual period 04/06/2014, SpO2 96 %. Physical Examination:  General appearance - alert, well appearing, and in no distress Heart - normal rate and regular rhythm Abdomen - soft, nontender, nondistended Fundal Height:  size equals dates Cervical Exam: Not evaluated.  Extremities: extremities normal, atraumatic, no cyanosis or edema and Homans sign is negative, no sign of DVT Membranes:intact  Fetal Monitoring:     Fetal Heart Rate A      Mode  External filed at 11/11/2014 0705    Baseline Rate (A)  130 bpm filed at 11/11/2014 0705    Variability  6-25 BPM filed at 11/11/2014 0705    Accelerations  15 x 15 filed at 11/11/2014 0705    Decelerations  None filed at 11/11/2014 0705        Labs:  Results for orders placed or performed during the hospital encounter of 11/10/14 (from the past 24 hour(s))  Glucose, capillary   Collection Time: 11/11/14  7:59 AM  Result Value Ref Range   Glucose-Capillary 128 (H) 65 - 99 mg/dL  Glucose, capillary   Collection Time: 11/11/14 10:33 AM  Result Value Ref Range   Glucose-Capillary 131 (H) 65 - 99 mg/dL   Comment 1 Notify RN    Comment 2 Document in Chart   Glucose,  capillary   Collection Time: 11/11/14  4:07 PM  Result Value Ref Range   Glucose-Capillary 182 (H) 65 - 99 mg/dL   Comment 1 Notify RN    Comment 2 Document in Chart   Glucose, capillary   Collection Time: 11/11/14 11:52 PM  Result Value Ref Range   Glucose-Capillary 144 (H) 65 - 99 mg/dL      Medications:  Scheduled . docusate sodium  100 mg Oral Daily  . prenatal multivitamin  1 tablet Oral Q1200   I have reviewed the patient's current medications.  ASSESSMENT: [redacted]w[redacted]d Estimated Date of Delivery: 01/24/15  Sonogram 9/14: 5.2x2.7x7.6 cm abruption Patient Active Problem List   Diagnosis Date Noted  . Vaginal bleeding in pregnancy 11/10/2014  . Vaginal bleeding during pregnancy, antepartum 11/10/2014  . LGA (large for gestational age) fetus 11/09/2014  . H/O: cesarean section 11/09/2014  . Obesity 11/09/2014  . Placenta previa 11/09/2014  . Encounter for fetal anatomic survey   . Advanced maternal age in multigravida   . [redacted] weeks gestation of pregnancy     PLAN: S/P 2 doses betamethasone Continue in hospital observation  EURE,LUTHER H 11/12/2014,7:11 AM

## 2014-11-12 NOTE — Progress Notes (Signed)
Patient back from MRI scan.

## 2014-11-12 NOTE — Progress Notes (Signed)
Off monitor for transport to [facility] for MRI.

## 2014-11-12 NOTE — Progress Notes (Signed)
Patient ID: Haley Moses, female   DOB: 01-14-1976, 39 y.o.   MRN: [id number] Reviewed u/s images with MFM. They recommend MRI, if we are uncomfortable or unable to do a dissection of the placenta off the bladder at time of surgery with transfer to [facility] possibly. On arrival to floor, nursing tells me of change in FHR.  FHR reviewed back to admission, when fetus was Category 1. At this stage, fetus is category 2 with moderate variability, no accels, few mild variable decels. Will get MRI, if fetal status is favorable.

## 2014-11-13 DIAGNOSIS — O43213 Placenta accreta, third trimester: Secondary | ICD-10-CM

## 2014-11-13 DIAGNOSIS — O24410 Gestational diabetes mellitus in pregnancy, diet controlled: Secondary | ICD-10-CM

## 2014-11-13 DIAGNOSIS — O4693 Antepartum hemorrhage, unspecified, third trimester: Secondary | ICD-10-CM

## 2014-11-13 LAB — GLUCOSE, CAPILLARY
GLUCOSE-CAPILLARY: 99 mg/dL (ref 65–99)
GLUCOSE-CAPILLARY: 97 mg/dL (ref 65–99)
GLUCOSE-CAPILLARY: 95 mg/dL (ref 65–99)
GLUCOSE-CAPILLARY: 100 mg/dL — AB (ref 65–99)

## 2014-11-13 NOTE — Progress Notes (Signed)
Patient ID: Zion Lint, female   DOB: September 30, 1975, 39 y.o.   MRN: [id number]  FACULTY PRACTICE ANTEPARTUM NOTE  Britny Schwinn is a 39 y.o. [id number] at [redacted]w[redacted]d  who is admitted for vaginal bleeding.   Fetal presentation is cephalic. Length of Stay:  3  Days  Subjective: No active bleeding.  Last bleed 9/13.  No other complications. Blood sugars have been controlled throughout the pregnancy. Patient received betamethasone 2 doses. Patient has been following diabetic diet in the hospital. Patient reports good fetal movement.   She reports no uterine contractions She reports no bleeding  She reports no loss of fluid per vagina.  Vitals:  Blood pressure 89/68, pulse 109, temperature 98.5 F (36.9 C), temperature source Oral, resp. rate 20, height  (1.575 m), weight 186 lb 11.2 oz (84.687 kg), last menstrual period 04/06/2014, SpO2 96 %. Physical Examination:  General appearance - alert, well appearing, and in no distress Chest - clear to auscultation, no wheezes, rales or rhonchi, symmetric air entry Heart - normal rate, regular rhythm, normal S1, S2, no murmurs, rubs, clicks or gallops Abdomen - soft, nontender, nondistended, no masses or organomegaly Fundal Height:  size equals dates Extremities: extremities normal, atraumatic, no cyanosis or edema  Membranes:intact  Fetal Monitoring:  Baseline: 150 bpm, Variability: Good {> 6 bpm), Accelerations: Reactive and Decelerations: Absent  Labs:  Results for orders placed or performed during the hospital encounter of 11/10/14 (from the past 24 hour(s))  Glucose, capillary   Collection Time: 11/12/14 11:30 AM  Result Value Ref Range   Glucose-Capillary 113 (H) 65 - 99 mg/dL  Glucose, capillary   Collection Time: 11/12/14  5:28 PM  Result Value Ref Range   Glucose-Capillary 119 (H) 65 - 99 mg/dL   Comment 1 Notify RN    Comment 2 Document in Chart   Glucose, capillary   Collection Time: 11/12/14  8:02 PM  Result Value Ref  Range   Glucose-Capillary 117 (H) 65 - 99 mg/dL  Glucose, capillary   Collection Time: 11/13/14  6:15 AM  Result Value Ref Range   Glucose-Capillary 99 65 - 99 mg/dL    Imaging Studies:    Mr Pelvis Wo Contrast  11/12/2014   CLINICAL DATA:  Placenta previa with hemorrhage. Antepartum. LMP 04/06/2014. [facility] 01/24/2015 based on early ultrasound. Two previous C-section deliveries.  EXAM: MRI PELVIS WITHOUT CONTRAST  TECHNIQUE: Multiplanar multisequence MR imaging of the pelvis was performed. No intravenous contrast was administered.  COMPARISON:  Ultrasound 11/11/2014  FINDINGS: Single intrauterine fetus is identified in cephalic presentation. Anterior low lying placenta identified with tip of the placenta 1.4 cm from the internal os. There is a focal abruption along the left lower inferior aspect of the placenta. The clot extends posteriorly, covering the internal os. This collection measures 7.2 x 2.7 cm.  The C-section scar is identified along the dome of the bladder. There is focal loss of myometrial signal within the region of the C-section scar, raising the question of placenta accreta versus increta. There is no evidence for placenta percreta however. The bladder has a normal appearance.  The ovaries have a normal appearance. Trace free pelvic fluid. Bowel loops are normal in appearance. The appendix is well seen and normal in appearance.  IMPRESSION: 1. Anterior low-lying placenta. 2. Question of focal placenta increta at the C-section scar. The findings could also be caused by thin, fibrous scar at the C-section site. There is no evidence for placenta percreta however. 3. Left lateral inferior focal  abruption with clot covering the internal os. 4. The salient findings were discussed with TANYA PRATT on 11/12/2014 at 5:31 pm.   Electronically Signed   By: Norva Pavlov M.D.   On: 11/12/2014 17:32      Medications:  Scheduled . docusate sodium  100 mg Oral Daily  . prenatal multivitamin  1 tablet  Oral Q1200   I have reviewed the patient's current medications.  ASSESSMENT: Patient Active Problem List   Diagnosis Date Noted  . Vaginal bleeding during pregnancy, antepartum 11/10/2014  . LGA (large for gestational age) fetus 11/09/2014  . H/O: cesarean section 11/09/2014  . Obesity 11/09/2014  . Placenta previa 11/09/2014  . Advanced maternal age in multigravida     PLAN: 1.  Vaginal Bleeding  Last bleed 9/13  Continue monitoring  Twice daily NST 2.  Placenta Previa  On MRI placental edge is 1.4 cm from cervix  Clot seen on MRI and ultrasound 3.  ? Accreta vs increta   We'll discuss with MFM regarding location of delivery with patient. 4.  A1 diabetes  We'll continue to monitor CBGs fasting and postprandial.  May need to start oral medication briefly due to elevated blood sugars with betamethasone.  Continue routine antenatal care.   Rhona Raider Aurelio Mccamy, DO 11/13/2014,8:57 AM

## 2014-11-14 DIAGNOSIS — O09523 Supervision of elderly multigravida, third trimester: Secondary | ICD-10-CM

## 2014-11-14 DIAGNOSIS — O3663X0 Maternal care for excessive fetal growth, third trimester, not applicable or unspecified: Secondary | ICD-10-CM

## 2014-11-14 DIAGNOSIS — Z3A29 29 weeks gestation of pregnancy: Secondary | ICD-10-CM

## 2014-11-14 DIAGNOSIS — O24419 Gestational diabetes mellitus in pregnancy, unspecified control: Secondary | ICD-10-CM

## 2014-11-14 DIAGNOSIS — O3421 Maternal care for scar from previous cesarean delivery: Secondary | ICD-10-CM

## 2014-11-14 DIAGNOSIS — Z9889 Other specified postprocedural states: Secondary | ICD-10-CM

## 2014-11-14 DIAGNOSIS — O4413 Placenta previa with hemorrhage, third trimester: Secondary | ICD-10-CM

## 2014-11-14 LAB — GLUCOSE, CAPILLARY
GLUCOSE-CAPILLARY: 93 mg/dL (ref 65–99)
Glucose-Capillary: 85 mg/dL (ref 65–99)
Glucose-Capillary: 87 mg/dL (ref 65–99)
Glucose-Capillary: 86 mg/dL (ref 65–99)

## 2014-11-14 NOTE — Progress Notes (Signed)
Patient ID: Haley Moses, female   DOB: 1975/09/25, 39 y.o.   MRN: [id number]  FACULTY PRACTICE ANTEPARTUM NOTE  Haley Moses is a 39 y.o. [id number] at [redacted]w[redacted]d  who is admitted for vaginal bleeding.   Fetal presentation is cephalic. Length of Stay:  4  Days  Subjective: Patient is without complaints. No active bleeding.  Last bleed 9/13.  No other complications. Patient reports good fetal movement.   She reports no uterine contractions She reports no bleeding  She reports no loss of fluid per vagina.  Vitals:  Blood pressure 99/61, pulse 98, temperature 98.4 F (36.9 C), temperature source Oral, resp. rate 18, height  (1.575 m), weight 186 lb 11.2 oz (84.687 kg), last menstrual period 04/06/2014, SpO2 96 %. Physical Examination:  General appearance - alert, well appearing, and in no distress Chest - clear to auscultation, no wheezes, rales or rhonchi, symmetric air entry Heart - normal rate, regular rhythm, normal S1, S2, no murmurs, rubs, clicks or gallops Abdomen - soft, nontender, gravid Fundal Height:  size equals dates Extremities: extremities normal, atraumatic, no cyanosis or edema  Membranes:intact  Fetal Monitoring:  Baseline: 150 bpm, Variability: Good {> 6 bpm), Accelerations: Reactive and Decelerations: Absent Toco: no contractions  Labs:  Results for orders placed or performed during the hospital encounter of 11/10/14 (from the past 24 hour(s))  Glucose, capillary   Collection Time: 11/13/14  6:15 AM  Result Value Ref Range   Glucose-Capillary 99 65 - 99 mg/dL  Glucose, capillary   Collection Time: 11/13/14 10:33 AM  Result Value Ref Range   Glucose-Capillary 97 65 - 99 mg/dL   Comment 1 Notify RN    Comment 2 Document in Chart   Type and screen   Collection Time: 11/13/14 12:20 PM  Result Value Ref Range   ABO/RH(D) A POS    Antibody Screen NEG    Sample Expiration 11/16/2014   Glucose, capillary   Collection Time: 11/13/14  3:57 PM  Result Value  Ref Range   Glucose-Capillary 95 65 - 99 mg/dL   Comment 1 Notify RN    Comment 2 Document in Chart   Glucose, capillary   Collection Time: 11/13/14  8:29 PM  Result Value Ref Range   Glucose-Capillary 100 (H) 65 - 99 mg/dL    Imaging Studies:    No results found.    Medications:  Scheduled . docusate sodium  100 mg Oral Daily  . prenatal multivitamin  1 tablet Oral Q1200   I have reviewed the patient's current medications.  ASSESSMENT: Patient Active Problem List   Diagnosis Date Noted  . Gestational diabetes mellitus, class A1 11/13/2014  . 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 Vaginal bleeding during pregnancy, antepartum 11/10/2014  . LGA (large for gestational age) fetus 11/09/2014  . H/O: cesarean section 11/09/2014  . Obesity 11/09/2014  . Placenta previa 11/09/2014  . Advanced maternal age in multigravida     PLAN: 1.  Vaginal Bleeding  Last bleed 9/13  Continue monitoring vaginal bleeding  Twice daily NST  2.  Placenta Previa  On MRI placental edge is 1.4 cm from cervix  Clot seen on MRI and ultrasound  Possible placenta accreta vs increta. The case was discussed with Dr. Philomena Doheny (MFM) who agreed that the patient would benefit from a delivery at [facility]. Coordination of transfer of her care should be done around the time of discharge from this admission  3.  A1 diabetes  We'll continue to monitor  CBGs fasting and postprandial.  May need to start oral medication briefly due to elevated blood sugars with betamethasone.  Continue routine antenatal care.   Catalina Antigua, MD 11/14/2014,6:14 AM

## 2014-11-15 ENCOUNTER — Encounter (HOSPITAL_COMMUNITY): Payer: Self-pay | Admitting: Anesthesiology

## 2014-11-15 LAB — GLUCOSE, CAPILLARY
Glucose-Capillary: 87 mg/dL (ref 65–99)
Glucose-Capillary: 93 mg/dL (ref 65–99)
Glucose-Capillary: 85 mg/dL (ref 65–99)
Glucose-Capillary: 77 mg/dL (ref 65–99)

## 2014-11-15 LAB — PREPARE RBC (CROSSMATCH)

## 2014-11-15 MED ORDER — CITRIC ACID-SODIUM CITRATE 334-500 MG/5ML PO SOLN
ORAL | Status: AC
  Filled 2014-11-15: qty 15

## 2014-11-15 NOTE — Progress Notes (Signed)
Pad changed. One small streak of bright red blood noted. No clots, cramping, pt. reports no pain.

## 2014-11-15 NOTE — Progress Notes (Signed)
pericare provided. Pad changed. Upon patient lowering hips 1/3 of pad became instantly saturated. Dr. Shawnie Pons Notified of this and fhr.

## 2014-11-15 NOTE — Progress Notes (Signed)
Pad changed, moderate amount of red blood noted. No clots noted. Patient states that she does not have any pain at this time.

## 2014-11-15 NOTE — Consult Note (Addendum)
Ms. Scaletta is a 39 yo G4P2012 at approximately 30 weeks [facility]. She has a known placenta previa with concern for possible accreta. She denies any past medical history or problems with anesthesia or family hx of problems with anesthesia. NKDA and not currently taking any medications other than PNVs. She reports 2 prior C/S both under neuraxial. Her airway exam demonstrates MP 2 with TMD >6cm, full neck ROM, ULBT grade 1. Lungs CLA and cardiac exam demonstrates RRR. Currently she is being monitored by the [facility] team for bleeding. She is being kept NPO at this time. Requested 2 PIVs, and she is type and crossed for 2 units. Starting hgb 10.2. Will plan for combined spinal epidural if patient is taken nonurgently back to the OR but may require general anesthesia if she becomes hemodynamically unstable or Chys required. Discussed both neuraxial and general anesthesia with the patient and explained all risks/options, addressing all of her questions and concerns.

## 2014-11-15 NOTE — Progress Notes (Signed)
Patient ID: Haley Moses, female   DOB: 18-Nov-1975, 39 y.o.   MRN: [id number] Notified of patient with increasing bleeding.  Changed 3 pads in last 2 hours.  Good FM. Possible accreta vs. Increta noted. FHR reassuring at this time. Will continue to watch. She is s/p BMZ. May need urgent delivery, +/- hysterectomy.  Patient aware of plan.  NPO. Blood available.  CBC in am if stable.

## 2014-11-15 NOTE — Progress Notes (Signed)
Patient ID: Haley Moses, female   DOB: 1975/09/01, 39 y.o.   MRN: [id number] FACULTY PRACTICE ANTEPARTUM(COMPREHENSIVE) NOTE  Keron Gruetzmacher is a 39 y.o. [id number] at [redacted]w[redacted]d  who is admitted for vaginal bleeding. LLP, abruption, ?acreta.   Length of Stay:  5  Days  Subjective: Patient reports the fetal movement as active. Patient reports uterine contraction  activity as none. Patient reports  vaginal bleeding as none. Patient describes fluid per vagina as None.  Vitals:  Blood pressure 111/55, pulse 103, temperature 98.1 F (36.7 C), temperature source Oral, resp. rate 18, height  (1.575 m), weight 186 lb 11.2 oz (84.687 kg), last menstrual period 04/06/2014, SpO2 96 %. Physical Examination:  General appearance - alert, well appearing, and in no distress Abdomen - gravid, soft, non tender Extremities - Homan's sign negative bilaterally, no edema  Fetal Monitoring:  Baseline: 150 bpm, Variability: Good {> 6 bpm), Accelerations: Reactive and Decelerations: Absent  Labs:  Results for orders placed or performed during the hospital encounter of 11/10/14 (from the past 24 hour(s))  Glucose, capillary   Collection Time: 11/14/14  9:47 AM  Result Value Ref Range   Glucose-Capillary 87 65 - 99 mg/dL   Comment 1 Notify RN   Glucose, capillary   Collection Time: 11/14/14  3:04 PM  Result Value Ref Range   Glucose-Capillary 86 65 - 99 mg/dL   Comment 1 Notify RN   Glucose, capillary   Collection Time: 11/14/14 10:01 PM  Result Value Ref Range   Glucose-Capillary 93 65 - 99 mg/dL  Glucose, capillary   Collection Time: 11/15/14  6:05 AM  Result Value Ref Range   Glucose-Capillary 87 65 - 99 mg/dL    Medications:  Scheduled . docusate sodium  100 mg Oral Daily  . prenatal multivitamin  1 tablet Oral Q1200   I have reviewed the patient's current medications.  ASSESSMENT: Patient Active Problem List   Diagnosis Date Noted  . Gestational diabetes mellitus, class A1 11/13/2014   . 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 Vaginal bleeding during pregnancy, antepartum 11/10/2014  . LGA (large for gestational age) fetus 11/09/2014  . H/O: cesarean section 11/09/2014  . Obesity 11/09/2014  . Placenta previa 11/09/2014  . Advanced maternal age in multigravida     PLAN: Admission until 1 week post last bleed Plan for delivery at [facility] due to suspected accreta  Davit Vassar H. 11/15/2014,8:39 AM

## 2014-11-16 ENCOUNTER — Encounter: Payer: Medicaid Other | Admitting: Obstetrics and Gynecology

## 2014-11-16 LAB — TYPE AND SCREEN
ABO/RH(D): A POS
Antibody Screen: NEGATIVE

## 2014-11-16 LAB — CBC
HEMATOCRIT: 32 % — AB (ref 36.0–46.0)
HEMOGLOBIN: 10.5 g/dL — AB (ref 12.0–15.0)
MCH: 28.5 pg (ref 26.0–34.0)
MCHC: 32.8 g/dL (ref 30.0–36.0)
MCV: 87 fL (ref 78.0–100.0)
Platelets: 222 10*3/uL (ref 150–400)
RBC: 3.68 MIL/uL — ABNORMAL LOW (ref 3.87–5.11)
RDW: 15 % (ref 11.5–15.5)
WBC: 5.7 10*3/uL (ref 4.0–10.5)

## 2014-11-16 LAB — GLUCOSE, CAPILLARY: Glucose-Capillary: 69 mg/dL (ref 65–99)

## 2014-11-16 NOTE — Progress Notes (Signed)
Pt transferred to [facility].  Transported via [facility] in stable condition.

## 2014-11-16 NOTE — Discharge Summary (Signed)
    OB Discharge Summary  Patient Name: Haley Moses DOB: 07-May-1975 MRN: [id number]  Date of admission: 11/10/2014  Date of discharge:11/16/2014  Admitting diagnosis: Vaginal bleeding   Intrauterine pregnancy: [redacted]w[redacted]d     Secondary diagnosis: Placenta increta/accreter     Discharge diagnosis: Same                                                                                            Hospital course:  This is a 39 year old G4 P2 012 at 30 weeks and 1 day who presented to the maternity admissions unit at [facility] on 11/10/2014. She had been receiving care at the [facility] Department with care starting at 13 weeks. She had just been referred to the high-risk clinic for a failed glucose tolerance test, but had not been seen yet. The patient presented with vaginal bleeding on 11/10/2014. On ultrasound, the placenta was anterior and low-lying, approximately 7 mm from the cervical os. An MRI confirmed the possibility of a focal accreta/increta. The MRI showed no involvement of the bladder. Patient had another bleed yesterday. Due to the possibility of an accreta/increta, the patient is being transferred to [facility] for further evaluation.  Physical exam  Filed Vitals:   11/15/14 1609 11/15/14 1941 11/15/14 2338 11/16/14 0753  BP: 106/55 99/59 108/66 103/56  Pulse: 103 101 101 97  Temp: 98.2 F (36.8 C) 98.7 F (37.1 C)  98.4 F (36.9 C)  TempSrc: Oral Oral  Oral  Resp:  Height:      Weight:      SpO2:       General:   See Dr. Tawni Levy notes this morning for exam  Labs: Lab Results  Component Value Date   WBC 5.7 11/16/2014   HGB 10.5* 11/16/2014   HCT 32.0* 11/16/2014   MCV 87.0 11/16/2014   PLT 222 11/16/2014   No flowsheet data found.  Discharge instruction: per After Visit Summary and "Baby and Me Booklet".  Medications:   Medication List    TAKE these medications        ACCU-CHEK FASTCLIX LANCETS Misc  1 each by Percutaneous route  4 (four) times daily. For testing 4 times daily. DX GDM O24.419     cetirizine 10 MG tablet  Commonly known as:  ZYRTEC  Take 1 tablet (10 mg total) by mouth daily.     erythromycin ophthalmic ointment  Place a 1/2 inch ribbon of ointment into the left lower eyelid twice a day.     glucose blood test strip  Commonly known as:  ACCU-CHEK AVIVA  1 each by Other route 4 (four) times daily. For testing 4 times daily, DX GDM O24.419     prenatal multivitamin Tabs tablet  Take 1 tablet by mouth daily.        Diet: routine diet  Activity: Bedrest   11/16/2014 Zyan Mirkin JEHIEL, DO

## 2014-11-16 NOTE — Progress Notes (Signed)
Pad changed. Small amount of bright red blood noted on pad in combination with the 0159 pad check. Pad last changed at 2347.

## 2014-11-16 NOTE — Progress Notes (Signed)
Patient ID: Haley Moses, female   DOB: November 16, 1975, 39 y.o.   MRN: [id number] FACULTY PRACTICE ANTEPARTUM(COMPREHENSIVE) NOTE  Haley Moses is a 39 y.o. [id number] at [redacted]w[redacted]d by best clinical estimate who is admitted for abruptio.   Fetal presentation is cephalic. Length of Stay:  6  Days  Subjective: Has had a slowing of bleeding overnight. Blood on pad x 8 hours is minimal but bright red. Patient reports the fetal movement as active. Patient reports uterine contraction  activity as none. Patient reports  vaginal bleeding as less flow than a normal period. Patient describes fluid per vagina as None.  Vitals:  Blood pressure 108/66, pulse 101, temperature 98.7 F (37.1 C), temperature source Oral, resp. rate 16, height  (1.575 m), weight 186 lb 11.2 oz (84.687 kg), last menstrual period 04/06/2014, SpO2 96 %. Physical Examination:  General appearance - alert, well appearing, and in no distress Neck - supple Chest - normal effort Abdomen - gravid, NT Skin - normal coloration and turgor, no rashes, no suspicious skin lesions noted Fundal Height:  size equals dates Extremities: extremities normal, atraumatic, no cyanosis or edema  Membranes:intact  Fetal Monitoring:  Baseline: 155 bpm, Variability: Good {> 6 bpm), Accelerations: Reactive and Decelerations: Variable: mild  Labs:  Results for orders placed or performed during the hospital encounter of 11/10/14 (from the past 24 hour(s))  Glucose, capillary   Collection Time: 11/15/14  9:58 AM  Result Value Ref Range   Glucose-Capillary 93 65 - 99 mg/dL   Comment 1 Notify RN   Glucose, capillary   Collection Time: 11/15/14  3:04 PM  Result Value Ref Range   Glucose-Capillary 85 65 - 99 mg/dL   Comment 1 Notify RN   Prepare RBC   Collection Time: 11/15/14  3:29 PM  Result Value Ref Range   Order Confirmation ORDER PROCESSED BY BLOOD BANK   Glucose, capillary   Collection Time: 11/15/14  8:21 PM  Result Value Ref Range   Glucose-Capillary 77 65 - 99 mg/dL  Glucose, capillary   Collection Time: 11/16/14  6:21 AM  Result Value Ref Range   Glucose-Capillary 69 65 - 99 mg/dL  CBC   Collection Time: 11/16/14  6:25 AM  Result Value Ref Range   WBC 5.7 4.0 - 10.5 K/uL   RBC 3.68 (L) 3.87 - 5.11 MIL/uL   Hemoglobin 10.5 (L) 12.0 - 15.0 g/dL   HCT 14.7 (L) 82.9 - 56.2 %   MCV 87.0 78.0 - 100.0 fL   MCH 28.5 26.0 - 34.0 pg   MCHC 32.8 30.0 - 36.0 g/dL   RDW 13.0 86.5 - 78.4 %   Platelets 222 150 - 400 K/uL   FSBS are all < 90  Medications:  Scheduled . docusate sodium  100 mg Oral Daily  . prenatal multivitamin  1 tablet Oral Q1200   I have reviewed the patient's current medications.  ASSESSMENT: Patient Active Problem List   Diagnosis Date Noted  . Gestational diabetes mellitus, class A1 11/13/2014  . 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 Vaginal bleeding during pregnancy, antepartum 11/10/2014  . LGA (large for gestational age) fetus 11/09/2014  . H/O: cesarean section 11/09/2014  . Obesity 11/09/2014  . Placenta previa 11/09/2014  . Advanced maternal age in multigravida     PLAN: Worsening bleeding in low lying placenta with possible accreta.  Will need to discuss possible transfer today with MFM. Hgb is stable today. GDM stable  PRATT,TANYA S, MD 11/16/2014,7:24 AM

## 2014-11-17 LAB — TYPE AND SCREEN
ABO/RH(D): A POS
Antibody Screen: NEGATIVE
UNIT DIVISION: 0
Unit division: 0

## 2014-12-02 ENCOUNTER — Ambulatory Visit (HOSPITAL_COMMUNITY): Payer: Medicaid Other | Attending: Nurse Practitioner

## 2014-12-14 ENCOUNTER — Telehealth: Payer: Self-pay | Admitting: *Deleted

## 2014-12-14 NOTE — Telephone Encounter (Signed)
Received message left on nurse line on 12/14/14 at 1539.  Patient states she needs a refill on her medication.

## 2014-12-15 NOTE — Telephone Encounter (Signed)
Called patient with [employer] [phone] and left a message we are returning your call, please call clinic back.

## 2014-12-16 ENCOUNTER — Ambulatory Visit (HOSPITAL_COMMUNITY): Payer: Medicaid Other

## 2014-12-21 ENCOUNTER — Encounter: Payer: Self-pay | Admitting: *Deleted

## 2014-12-21 NOTE — Telephone Encounter (Signed)
Pt not returning calls. Reviewed rx list and see that only rx from us is for lancets and strips, both have 12 refills available. Will wait for patient to call back with further information.

## 2014-12-22 ENCOUNTER — Encounter: Payer: Self-pay | Admitting: *Deleted

## 2014-12-23 ENCOUNTER — Ambulatory Visit (HOSPITAL_COMMUNITY): Payer: Medicaid Other

## 2014-12-30 ENCOUNTER — Ambulatory Visit (HOSPITAL_COMMUNITY)
Admission: RE | Admit: 2014-12-30 | Discharge: 2014-12-30 | Disposition: A | Discharge status: Home / Self Care | Payer: Medicaid Other | Source: Ambulatory Visit | Attending: Nurse Practitioner | Admitting: Nurse Practitioner

## 2014-12-30 DIAGNOSIS — O09523 Supervision of elderly multigravida, third trimester: Secondary | ICD-10-CM

## 2014-12-30 DIAGNOSIS — O4420 Partial placenta previa NOS or without hemorrhage, unspecified trimester: Secondary | ICD-10-CM

## 2014-12-30 DIAGNOSIS — Z3A36 36 weeks gestation of pregnancy: Secondary | ICD-10-CM

## 2014-12-30 DIAGNOSIS — IMO0002 Reserved for concepts with insufficient information to code with codable children: Secondary | ICD-10-CM

## 2015-01-06 ENCOUNTER — Ambulatory Visit (HOSPITAL_COMMUNITY): Payer: Medicaid Other

## 2015-01-08 ENCOUNTER — Encounter: Payer: Self-pay | Admitting: Obstetrics and Gynecology

## 2015-01-08 ENCOUNTER — Ambulatory Visit (INDEPENDENT_AMBULATORY_CARE_PROVIDER_SITE_OTHER): Payer: Medicaid Other | Admitting: Obstetrics and Gynecology

## 2015-01-08 NOTE — Progress Notes (Signed)
  Subjective:     Haley Moses is a 39 y.o. female who presents for a postpartum visit. She is 5 weeks postpartum following a low cervical transverse Cesarean section. I have fully reviewed the prenatal and intrapartum course. The delivery was at 30 gestational weeks. Outcome: repeat cesarean section, low transverse incision secondary to bleeding placenta previa. Anesthesia: spinal. Postpartum course has been uncomplicated. Baby's course has been complicated by issues related to prematurity but is now home. Baby is feeding by breast. Bleeding no bleeding. Bowel function is normal. Bladder function is normal. Patient is not sexually active. Contraception method is none. Postpartum depression screening: negative.     Review of Systems Pertinent items are noted in HPI.   Objective:    BP 109/61 mmHg  Pulse 78  Temp(Src) 98.5 F (36.9 C)  Wt 177 lb (80.287 kg)  LMP 04/06/2014  General:  alert, cooperative and no distress   Breasts:  inspection negative, no nipple discharge or bleeding, no masses or nodularity palpable  Lungs: clear to auscultation bilaterally  Heart:  regular rate and rhythm  Abdomen: soft, non-tender; bowel sounds normal; no masses,  no organomegaly  Incision: completely healed   Vulva:  normal  Vagina: normal vagina, no discharge, exudate, lesion, or erythema  Cervix:  multiparous appearance  Corpus: normal size, contour, position, consistency, mobility, non-tender  Adnexa:  normal adnexa and no mass, fullness, tenderness  Rectal Exam: Not performed.        Assessment:     Normal postpartum exam. Pap smear not done at today's visit.   Plan:    1. Contraception: patient has RX for OCP 2. Patient is medically cleared to resume all activities of daily living 3. Follow up in: a few days for 2 hour glucola test or as needed.

## 2015-01-11 ENCOUNTER — Ambulatory Visit: Payer: Medicaid Other | Admitting: Obstetrics and Gynecology

## 2015-01-13 ENCOUNTER — Ambulatory Visit (HOSPITAL_COMMUNITY): Payer: Medicaid Other

## 2015-01-20 ENCOUNTER — Ambulatory Visit (HOSPITAL_COMMUNITY): Payer: Medicaid Other

## 2015-01-25 ENCOUNTER — Other Ambulatory Visit: Payer: Medicaid Other

## 2015-01-26 ENCOUNTER — Other Ambulatory Visit: Payer: Medicaid Other

## 2015-01-26 DIAGNOSIS — O24429 Gestational diabetes mellitus in childbirth, unspecified control: Secondary | ICD-10-CM

## 2015-01-27 LAB — GLUCOSE TOLERANCE, 2 HOURS
GLUCOSE, 2 HOUR: 115 mg/dL (ref 70–139)
Glucose, Fasting: 81 mg/dL (ref 65–99)

## 2015-01-28 ENCOUNTER — Telehealth: Payer: Self-pay | Admitting: *Deleted

## 2015-01-28 NOTE — Telephone Encounter (Addendum)
Per message from Dr. Macon LargeAnyanwu- patient had Normal 2 hr GTT. Please call to inform patient of results.  Called Hokulani with [employer]#[phone] for Amharic.  Left message we are calling with some information- please call clinic.

## 2015-02-01 ENCOUNTER — Other Ambulatory Visit: Payer: Self-pay | Admitting: General Practice

## 2015-02-01 MED ORDER — PRENATAL VITAMINS 0.8 MG PO TABS: 1.0000 | ORAL_TABLET | Freq: Every day | ORAL | Status: DC

## 2015-02-01 MED ORDER — PRENATAL MULTIVITAMIN CH: 1.0000 | ORAL_TABLET | Freq: Every day | ORAL | Status: DC

## 2015-02-02 NOTE — Telephone Encounter (Signed)
Pt returned our call and was informed of normal 2hr GTT result. She expressed gratitude and voiced understanding.

## 2015-02-02 NOTE — Telephone Encounter (Signed)
Called Kadi with [employer] @[phone] .  Left message we are calling with some information- please call clinic.

## 2015-04-02 ENCOUNTER — Ambulatory Visit (INDEPENDENT_AMBULATORY_CARE_PROVIDER_SITE_OTHER): Payer: Medicaid Other | Admitting: Obstetrics and Gynecology

## 2015-04-02 ENCOUNTER — Encounter: Payer: Self-pay | Admitting: Obstetrics and Gynecology

## 2015-04-02 VITALS — BP 104/65 | HR 73 | Temp 98.5°F | Resp 16 | Wt 179.1 lb

## 2015-04-02 DIAGNOSIS — Z3042 Encounter for surveillance of injectable contraceptive: Secondary | ICD-10-CM

## 2015-04-02 DIAGNOSIS — Z30013 Encounter for initial prescription of injectable contraceptive: Secondary | ICD-10-CM

## 2015-04-02 LAB — POCT PREGNANCY, URINE: PREG TEST UR: NEGATIVE

## 2015-04-02 MED ORDER — MEDROXYPROGESTERONE ACETATE 150 MG/ML IM SUSP
150.0000 mg | INTRAMUSCULAR | Status: AC
  Administered 2015-06-21: 150 mg via INTRAMUSCULAR

## 2015-04-02 MED ORDER — MEDROXYPROGESTERONE ACETATE 150 MG/ML IM SUSP
150.0000 mg | Freq: Once | INTRAMUSCULAR | Status: DC
  Administered 2015-04-02: 150 mg via INTRAMUSCULAR

## 2015-04-02 NOTE — Progress Notes (Signed)
Patient ID: Haley Moses, female   DOB: May 11, 1975, 40 y.o.   MRN: [id number] 40 yo [id number] presenting today for depo-provera. Patient reports normal cycles. She has not been sexually active as her husband is out of the state. She desires to avoid taking birth control pills and is interested in depo-provera  Past Medical History  Diagnosis Date  . Gestational diabetes   . Headache    Past Surgical History  Procedure Laterality Date  . Cesarean section  2012, 2014     x 2   Family History  Problem Relation Age of Onset  . Early death Mother    Social History  Substance Use Topics  . Smoking status: Never Smoker   . Smokeless tobacco: Never Used  . Alcohol Use: No   ROS See pertinent in HPI Blood pressure 104/65, pulse 73, temperature 98.5 F (36.9 C), resp. rate 16, weight 179 lb 1.6 oz (81.239 kg), last menstrual period 04/06/2014, unknown if currently breastfeeding.  GENERAL: Well-developed, well-nourished female in no acute distress.  NEURO: alert and oriented x 3 EXTREMITIES: No cyanosis, clubbing, or edema, 2+ distal pulses.  A/P 40 yo here for depo-provera initiation - Discussed potential weight gain associated with this birth control secondary to increased appetite. Advised patient to opt for healthy food options and to increase physical activity - Discussed amenorrhea associated with Depo-provera - patient verbalized understanding and all questions were answered - Condoms for 2 weeks - RTC for depo-provera or prn

## 2015-06-21 ENCOUNTER — Ambulatory Visit (INDEPENDENT_AMBULATORY_CARE_PROVIDER_SITE_OTHER): Payer: Medicaid Other | Admitting: *Deleted

## 2015-06-21 ENCOUNTER — Encounter: Payer: Self-pay | Admitting: *Deleted

## 2015-06-21 VITALS — BP 103/65 | HR 74 | Temp 98.4°F | Wt 181.6 lb

## 2015-06-21 DIAGNOSIS — Z3042 Encounter for surveillance of injectable contraceptive: Secondary | ICD-10-CM | POA: Diagnosis present

## 2015-09-06 ENCOUNTER — Ambulatory Visit: Payer: Medicaid Other

## 2015-09-27 ENCOUNTER — Observation Stay (HOSPITAL_COMMUNITY)
Admission: EM | Admit: 2015-09-27 | Discharge: 2015-09-28 | Disposition: A | Discharge status: Home / Self Care | Payer: PRIVATE HEALTH INSURANCE | Attending: General Surgery | Admitting: General Surgery

## 2015-09-27 ENCOUNTER — Emergency Department (HOSPITAL_COMMUNITY): Payer: PRIVATE HEALTH INSURANCE

## 2015-09-27 ENCOUNTER — Encounter (HOSPITAL_COMMUNITY)
Admission: EM | Disposition: A | Discharge status: Home / Self Care | Payer: Self-pay | Source: Home / Self Care | Attending: Emergency Medicine

## 2015-09-27 ENCOUNTER — Observation Stay (HOSPITAL_COMMUNITY): Payer: PRIVATE HEALTH INSURANCE | Admitting: Certified Registered"

## 2015-09-27 ENCOUNTER — Observation Stay (HOSPITAL_COMMUNITY): Payer: PRIVATE HEALTH INSURANCE

## 2015-09-27 ENCOUNTER — Encounter (HOSPITAL_COMMUNITY): Payer: Self-pay | Admitting: Emergency Medicine

## 2015-09-27 DIAGNOSIS — K8020 Calculus of gallbladder without cholecystitis without obstruction: Secondary | ICD-10-CM | POA: Diagnosis present

## 2015-09-27 DIAGNOSIS — R109 Unspecified abdominal pain: Secondary | ICD-10-CM | POA: Diagnosis present

## 2015-09-27 DIAGNOSIS — K8012 Calculus of gallbladder with acute and chronic cholecystitis without obstruction: Secondary | ICD-10-CM | POA: Diagnosis not present

## 2015-09-27 DIAGNOSIS — K8000 Calculus of gallbladder with acute cholecystitis without obstruction: Secondary | ICD-10-CM | POA: Diagnosis present

## 2015-09-27 HISTORY — PX: CHOLECYSTECTOMY: SHX55

## 2015-09-27 LAB — URINE MICROSCOPIC-ADD ON

## 2015-09-27 LAB — COMPREHENSIVE METABOLIC PANEL
ALBUMIN: 4.3 g/dL (ref 3.5–5.0)
ALK PHOS: 54 U/L (ref 38–126)
ALT: 18 U/L (ref 14–54)
ANION GAP: 8 (ref 5–15)
AST: 22 U/L (ref 15–41)
BILIRUBIN TOTAL: 0.2 mg/dL — AB (ref 0.3–1.2)
BUN: 8 mg/dL (ref 6–20)
CALCIUM: 8.9 mg/dL (ref 8.9–10.3)
CO2: 23 mmol/L (ref 22–32)
CREATININE: 0.55 mg/dL (ref 0.44–1.00)
Chloride: 107 mmol/L (ref 101–111)
GFR calc Af Amer: >60 mL/min (ref >60)
GFR calc non Af Amer: >60 mL/min (ref >60)
GLUCOSE: 178 mg/dL — AB (ref 65–99)
Potassium: 3.2 mmol/L — ABNORMAL LOW (ref 3.5–5.1)
SODIUM: 138 mmol/L (ref 135–145)
TOTAL PROTEIN: 8.1 g/dL (ref 6.5–8.1)

## 2015-09-27 LAB — LIPASE, BLOOD: Lipase: 24 U/L (ref 11–51)

## 2015-09-27 LAB — URINALYSIS, ROUTINE W REFLEX MICROSCOPIC
BILIRUBIN URINE: NEGATIVE
Glucose, UA: 100 mg/dL — AB
KETONES UR: NEGATIVE mg/dL
Leukocytes, UA: NEGATIVE
NITRITE: NEGATIVE
Protein, ur: 30 mg/dL — AB
SPECIFIC GRAVITY, URINE: 1.03 (ref 1.005–1.030)
pH: 6 (ref 5.0–8.0)

## 2015-09-27 LAB — CBC
HCT: 39.1 % (ref 36.0–46.0)
Hemoglobin: 13.1 g/dL (ref 12.0–15.0)
MCH: 28.1 pg (ref 26.0–34.0)
MCHC: 33.5 g/dL (ref 30.0–36.0)
MCV: 83.7 fL (ref 78.0–100.0)
PLATELETS: 294 10*3/uL (ref 150–400)
RBC: 4.67 MIL/uL (ref 3.87–5.11)
RDW: 13.4 % (ref 11.5–15.5)
WBC: 9.4 10*3/uL (ref 4.0–10.5)

## 2015-09-27 LAB — I-STAT BETA HCG BLOOD, ED (MC, WL, AP ONLY)

## 2015-09-27 SURGERY — LAPAROSCOPIC CHOLECYSTECTOMY WITH INTRAOPERATIVE CHOLANGIOGRAM
Anesthesia: General | Site: Abdomen

## 2015-09-27 MED ORDER — FENTANYL CITRATE (PF) 250 MCG/5ML IJ SOLN
INTRAMUSCULAR | Status: DC | PRN
  Administered 2015-09-27 (×5): 50 ug via INTRAVENOUS

## 2015-09-27 MED ORDER — FENTANYL CITRATE (PF) 250 MCG/5ML IJ SOLN
INTRAMUSCULAR | Status: AC
  Filled 2015-09-27: qty 5

## 2015-09-27 MED ORDER — METOCLOPRAMIDE HCL 5 MG/ML IJ SOLN: 10.0000 mg | Freq: Once | INTRAMUSCULAR | Status: DC | PRN

## 2015-09-27 MED ORDER — BUPIVACAINE HCL (PF) 0.5 % IJ SOLN
INTRAMUSCULAR | Status: AC
  Filled 2015-09-27: qty 30

## 2015-09-27 MED ORDER — HYDROMORPHONE HCL 1 MG/ML IJ SOLN
0.5000 mg | INTRAMUSCULAR | Status: DC | PRN
  Administered 2015-09-27: 0.5 mg via INTRAVENOUS

## 2015-09-27 MED ORDER — HYDROMORPHONE HCL 1 MG/ML IJ SOLN
INTRAMUSCULAR | Status: AC
  Filled 2015-09-27: qty 1

## 2015-09-27 MED ORDER — KCL IN DEXTROSE-NACL 20-5-0.9 MEQ/L-%-% IV SOLN
INTRAVENOUS | Status: DC
  Administered 2015-09-27 – 2015-09-28 (×2): via INTRAVENOUS
  Filled 2015-09-27 (×3): qty 1000

## 2015-09-27 MED ORDER — ONDANSETRON HCL 4 MG/2ML IJ SOLN
4.0000 mg | Freq: Once | INTRAMUSCULAR | Status: AC
  Administered 2015-09-27: 4 mg via INTRAVENOUS
  Filled 2015-09-27: qty 2

## 2015-09-27 MED ORDER — DEXTROSE 5 % IV SOLN
INTRAVENOUS | Status: AC
  Filled 2015-09-27: qty 2

## 2015-09-27 MED ORDER — HYDROMORPHONE HCL 1 MG/ML IJ SOLN
1.0000 mg | Freq: Once | INTRAMUSCULAR | Status: AC
  Administered 2015-09-27: 1 mg via INTRAVENOUS
  Filled 2015-09-27: qty 1

## 2015-09-27 MED ORDER — ONDANSETRON 4 MG PO TBDP
4.0000 mg | ORAL_TABLET | Freq: Once | ORAL | Status: AC | PRN
  Administered 2015-09-27: 4 mg via ORAL
  Filled 2015-09-27: qty 1

## 2015-09-27 MED ORDER — ONDANSETRON 4 MG PO TBDP: 4.0000 mg | ORAL_TABLET | Freq: Four times a day (QID) | ORAL | Status: DC | PRN

## 2015-09-27 MED ORDER — ONDANSETRON HCL 4 MG/2ML IJ SOLN
INTRAMUSCULAR | Status: AC
  Filled 2015-09-27: qty 2

## 2015-09-27 MED ORDER — OXYCODONE HCL 5 MG PO TABS
5.0000 mg | ORAL_TABLET | ORAL | Status: DC | PRN
  Administered 2015-09-28: 5 mg via ORAL
  Filled 2015-09-27: qty 2

## 2015-09-27 MED ORDER — HYDROMORPHONE HCL 1 MG/ML IJ SOLN: 1.0000 mg | INTRAMUSCULAR | Status: DC | PRN

## 2015-09-27 MED ORDER — FENTANYL CITRATE (PF) 100 MCG/2ML IJ SOLN
25.0000 ug | INTRAMUSCULAR | Status: DC | PRN
Start: 2015-09-27 — End: 2015-09-27
  Administered 2015-09-27 (×2): 50 ug via INTRAVENOUS

## 2015-09-27 MED ORDER — NEOSTIGMINE METHYLSULFATE 10 MG/10ML IV SOLN
INTRAVENOUS | Status: AC
  Filled 2015-09-27: qty 1

## 2015-09-27 MED ORDER — IOPAMIDOL (ISOVUE-300) INJECTION 61%
INTRAVENOUS | Status: DC | PRN
  Administered 2015-09-27: 2.5 mL

## 2015-09-27 MED ORDER — LIDOCAINE HCL (CARDIAC) 20 MG/ML IV SOLN
INTRAVENOUS | Status: AC
  Filled 2015-09-27: qty 5

## 2015-09-27 MED ORDER — GLYCOPYRROLATE 0.2 MG/ML IJ SOLN
INTRAMUSCULAR | Status: AC
  Filled 2015-09-27: qty 3

## 2015-09-27 MED ORDER — ROCURONIUM BROMIDE 100 MG/10ML IV SOLN
INTRAVENOUS | Status: DC | PRN
  Administered 2015-09-27: 5 mg via INTRAVENOUS
  Administered 2015-09-27: 25 mg via INTRAVENOUS

## 2015-09-27 MED ORDER — SUCCINYLCHOLINE CHLORIDE 20 MG/ML IJ SOLN
INTRAMUSCULAR | Status: DC | PRN
  Administered 2015-09-27: 100 mg via INTRAVENOUS

## 2015-09-27 MED ORDER — PROPOFOL 10 MG/ML IV BOLUS
INTRAVENOUS | Status: AC
  Filled 2015-09-27: qty 20

## 2015-09-27 MED ORDER — LIDOCAINE HCL (CARDIAC) 20 MG/ML IV SOLN
INTRAVENOUS | Status: DC | PRN
  Administered 2015-09-27: 40 mg via INTRAVENOUS

## 2015-09-27 MED ORDER — FENTANYL CITRATE (PF) 100 MCG/2ML IJ SOLN
50.0000 ug | INTRAMUSCULAR | Status: DC | PRN
  Administered 2015-09-27 (×2): 50 ug via INTRAVENOUS
  Filled 2015-09-27 (×2): qty 2

## 2015-09-27 MED ORDER — DEXTROSE 5 % IV SOLN
2.0000 g | INTRAVENOUS | Status: DC
  Administered 2015-09-27: 2 g via INTRAVENOUS

## 2015-09-27 MED ORDER — MIDAZOLAM HCL 2 MG/2ML IJ SOLN
INTRAMUSCULAR | Status: AC
  Filled 2015-09-27: qty 2

## 2015-09-27 MED ORDER — LACTATED RINGERS IV SOLN
INTRAVENOUS | Status: DC
  Administered 2015-09-27 (×3): via INTRAVENOUS

## 2015-09-27 MED ORDER — DIPHENHYDRAMINE HCL 50 MG/ML IJ SOLN: 25.0000 mg | Freq: Four times a day (QID) | INTRAMUSCULAR | Status: DC | PRN

## 2015-09-27 MED ORDER — 0.9 % SODIUM CHLORIDE (POUR BTL) OPTIME
TOPICAL | Status: DC | PRN
  Administered 2015-09-27: 1000 mL

## 2015-09-27 MED ORDER — MORPHINE SULFATE (PF) 2 MG/ML IV SOLN
2.0000 mg | INTRAVENOUS | Status: DC | PRN
  Administered 2015-09-28 (×2): 4 mg via INTRAVENOUS
  Filled 2015-09-27 (×2): qty 2

## 2015-09-27 MED ORDER — PROPOFOL 10 MG/ML IV BOLUS
INTRAVENOUS | Status: DC | PRN
  Administered 2015-09-27: 180 mg via INTRAVENOUS

## 2015-09-27 MED ORDER — LACTATED RINGERS IV SOLN: INTRAVENOUS | Status: DC

## 2015-09-27 MED ORDER — IOPAMIDOL (ISOVUE-300) INJECTION 61%
INTRAVENOUS | Status: AC
  Filled 2015-09-27: qty 50

## 2015-09-27 MED ORDER — BUPIVACAINE HCL 0.5 % IJ SOLN
INTRAMUSCULAR | Status: DC | PRN
  Administered 2015-09-27: 14 mL

## 2015-09-27 MED ORDER — ONDANSETRON HCL 4 MG/2ML IJ SOLN
INTRAMUSCULAR | Status: DC | PRN
  Administered 2015-09-27: 4 mg via INTRAVENOUS

## 2015-09-27 MED ORDER — POTASSIUM CHLORIDE IN NACL 40-0.9 MEQ/L-% IV SOLN
INTRAVENOUS | Status: DC
  Filled 2015-09-27: qty 1000

## 2015-09-27 MED ORDER — DIPHENHYDRAMINE HCL 25 MG PO CAPS: 25.0000 mg | ORAL_CAPSULE | Freq: Four times a day (QID) | ORAL | Status: DC | PRN

## 2015-09-27 MED ORDER — MEPERIDINE HCL 50 MG/ML IJ SOLN: 6.2500 mg | INTRAMUSCULAR | Status: DC | PRN

## 2015-09-27 MED ORDER — FENTANYL CITRATE (PF) 100 MCG/2ML IJ SOLN
INTRAMUSCULAR | Status: AC
  Filled 2015-09-27: qty 2

## 2015-09-27 MED ORDER — HEPARIN SODIUM (PORCINE) 5000 UNIT/ML IJ SOLN: 5000.0000 [IU] | Freq: Three times a day (TID) | INTRAMUSCULAR | Status: DC

## 2015-09-27 MED ORDER — ONDANSETRON HCL 4 MG/2ML IJ SOLN: 4.0000 mg | INTRAMUSCULAR | Status: DC | PRN

## 2015-09-27 MED ORDER — MIDAZOLAM HCL 2 MG/2ML IJ SOLN
INTRAMUSCULAR | Status: DC | PRN
  Administered 2015-09-27 (×2): 1 mg via INTRAVENOUS

## 2015-09-27 MED ORDER — SUGAMMADEX SODIUM 200 MG/2ML IV SOLN
INTRAVENOUS | Status: DC | PRN
  Administered 2015-09-27: 175 mg via INTRAVENOUS

## 2015-09-27 MED ORDER — ONDANSETRON HCL 4 MG/2ML IJ SOLN: 4.0000 mg | Freq: Four times a day (QID) | INTRAMUSCULAR | Status: DC | PRN

## 2015-09-27 MED ORDER — LACTATED RINGERS IR SOLN
Status: DC | PRN
  Administered 2015-09-27: 1000 mL

## 2015-09-27 MED ORDER — IOPAMIDOL (ISOVUE-300) INJECTION 61%
100.0000 mL | Freq: Once | INTRAVENOUS | Status: AC | PRN
  Administered 2015-09-27: 100 mL via INTRAVENOUS

## 2015-09-27 SURGICAL SUPPLY — 42 items
APPLIER CLIP 5 13 M/L LIGAMAX5 (MISCELLANEOUS) ×3
BENZOIN TINCTURE PRP APPL 2/3 (GAUZE/BANDAGES/DRESSINGS) ×3 IMPLANT
CABLE HIGH FREQUENCY MONO STRZ (ELECTRODE) ×3 IMPLANT
CATH REDDICK CHOLANGI 4FR 50CM (CATHETERS) ×3 IMPLANT
CHLORAPREP W/TINT 26ML (MISCELLANEOUS) ×3 IMPLANT
CLIP APPLIE 5 13 M/L LIGAMAX5 (MISCELLANEOUS) ×1 IMPLANT
CLOSURE WOUND 1/2 X4 (GAUZE/BANDAGES/DRESSINGS) ×1
COVER MAYO STAND STRL (DRAPES) ×3 IMPLANT
COVER SURGICAL LIGHT HANDLE (MISCELLANEOUS) ×3 IMPLANT
DECANTER SPIKE VIAL GLASS SM (MISCELLANEOUS) IMPLANT
DISSECTOR BLUNT TIP ENDO 5MM (MISCELLANEOUS) IMPLANT
DRAPE C-ARM 42X120 X-RAY (DRAPES) ×3 IMPLANT
DRAPE LAPAROSCOPIC ABDOMINAL (DRAPES) ×3 IMPLANT
DRSG TEGADERM 2-3/8X2-3/4 SM (GAUZE/BANDAGES/DRESSINGS) ×3 IMPLANT
ELECT REM PT RETURN 9FT ADLT (ELECTROSURGICAL) ×3
ELECTRODE REM PT RTRN 9FT ADLT (ELECTROSURGICAL) ×1 IMPLANT
ENDOLOOP SUT PDS II  0 18 (SUTURE)
ENDOLOOP SUT PDS II 0 18 (SUTURE) IMPLANT
GAUZE SPONGE 2X2 8PLY STRL LF (GAUZE/BANDAGES/DRESSINGS) ×1 IMPLANT
GLOVE ECLIPSE 8.0 STRL XLNG CF (GLOVE) ×3 IMPLANT
GLOVE INDICATOR 8.0 STRL GRN (GLOVE) ×3 IMPLANT
GOWN STRL REUS W/TWL XL LVL3 (GOWN DISPOSABLE) ×9 IMPLANT
HEMOSTAT SNOW SURGICEL 2X4 (HEMOSTASIS) ×3 IMPLANT
IRRIG SUCT STRYKERFLOW 2 WTIP (MISCELLANEOUS) ×3
IRRIGATION SUCT STRKRFLW 2 WTP (MISCELLANEOUS) ×1 IMPLANT
IV CATH 14GX2 1/4 (CATHETERS) ×3 IMPLANT
KIT BASIN OR (CUSTOM PROCEDURE TRAY) ×3 IMPLANT
POUCH RETRIEVAL ECOSAC 10 (ENDOMECHANICALS) ×1 IMPLANT
POUCH RETRIEVAL ECOSAC 10MM (ENDOMECHANICALS) ×2
POUCH SPECIMEN RETRIEVAL 10MM (ENDOMECHANICALS) ×3 IMPLANT
SCISSORS LAP 5X35 DISP (ENDOMECHANICALS) ×3 IMPLANT
SLEEVE XCEL OPT CAN 5 100 (ENDOMECHANICALS) ×6 IMPLANT
SPONGE GAUZE 2X2 STER 10/PKG (GAUZE/BANDAGES/DRESSINGS) ×2
STRIP CLOSURE SKIN 1/2X4 (GAUZE/BANDAGES/DRESSINGS) ×2 IMPLANT
SUT MNCRL AB 4-0 PS2 18 (SUTURE) ×3 IMPLANT
TOWEL OR 17X26 10 PK STRL BLUE (TOWEL DISPOSABLE) ×3 IMPLANT
TOWEL OR NON WOVEN STRL DISP B (DISPOSABLE) ×3 IMPLANT
TRAY LAPAROSCOPIC (CUSTOM PROCEDURE TRAY) ×3 IMPLANT
TROCAR BLADELESS OPT 5 100 (ENDOMECHANICALS) ×3 IMPLANT
TROCAR XCEL BLUNT TIP 100MML (ENDOMECHANICALS) ×3 IMPLANT
TROCAR XCEL NON-BLD 11X100MML (ENDOMECHANICALS) IMPLANT
TUBING INSUF HEATED (TUBING) ×3 IMPLANT

## 2015-09-27 NOTE — ED Notes (Signed)
Unable to collect PT labs PT request med before labs. RN have been made aware

## 2015-09-27 NOTE — ED Triage Notes (Signed)
Pt complaint of RUQ pain and nausea/vomtinig; pt denies diarrhea. Pt reports onset 0300 today; denies hx of same.

## 2015-09-27 NOTE — Transfer of Care (Signed)
Immediate Anesthesia Transfer of Care Note  Patient: Haley Moses  Procedure(s) Performed: Procedure(s): LAPAROSCOPIC CHOLECYSTECTOMY WITH INTRAOPERATIVE CHOLANGIOGRAM (N/A)  Patient Location: PACU  Anesthesia Type:General  Level of Consciousness: sedated  Airway & Oxygen Therapy: Patient Spontanous Breathing and Patient connected to face mask oxygen  Post-op Assessment: Report given to RN and Post -op Vital signs reviewed and stable  Post vital signs: Reviewed and stable  Last Vitals:  Vitals:   09/27/15 1337 09/27/15 1726  BP: 116/65   Pulse: 79   Resp: 18   Temp:  36.6 C    Last Pain:  Vitals:   09/27/15 1337  TempSrc:   PainSc: 9          Complications: No apparent anesthesia complications

## 2015-09-27 NOTE — Anesthesia Preprocedure Evaluation (Addendum)
Anesthesia Evaluation  Patient identified by MRN, date of birth, ID band Patient awake    Reviewed: Allergy & Precautions, NPO status , Patient's Chart, lab work & pertinent test results  Airway Mallampati: II  TM Distance: >3 FB Neck ROM: Full    Dental no notable dental hx. (+) Dental Advisory Given   Pulmonary neg pulmonary ROS,    Pulmonary exam normal breath sounds clear to auscultation       Cardiovascular negative cardio ROS Normal cardiovascular exam Rhythm:Regular Rate:Normal     Neuro/Psych negative neurological ROS  negative psych ROS   GI/Hepatic negative GI ROS, Neg liver ROS,   Endo/Other  diabetes, Gestational  Renal/GU negative Renal ROS  negative genitourinary   Musculoskeletal negative musculoskeletal ROS (+)   Abdominal   Peds negative pediatric ROS (+)  Hematology negative hematology ROS (+)   Anesthesia Other Findings   Reproductive/Obstetrics negative OB ROS                           Anesthesia Physical Anesthesia Plan  ASA: II  Anesthesia Plan: General   Post-op Pain Management:    Induction: Intravenous, Rapid sequence and Cricoid pressure planned  Airway Management Planned: Oral ETT  Additional Equipment:   Intra-op Plan:   Post-operative Plan: Extubation in OR  Informed Consent: I have reviewed the patients History and Physical, chart, labs and discussed the procedure including the risks, benefits and alternatives for the proposed anesthesia with the patient or authorized representative who has indicated his/her understanding and acceptance.   Dental advisory given  Plan Discussed with: CRNA  Anesthesia Plan Comments:         Anesthesia Quick Evaluation

## 2015-09-27 NOTE — Anesthesia Procedure Notes (Signed)
Procedure Name: Intubation Date/Time: 09/27/2015 3:46 PM Performed by: Minerva Ends Pre-anesthesia Checklist: Patient identified, Emergency Drugs available, Suction available and Patient being monitored Patient Re-evaluated:Patient Re-evaluated prior to inductionOxygen Delivery Method: Circle System Utilized Preoxygenation: Pre-oxygenation with 100% oxygen Intubation Type: IV induction, Rapid sequence and Cricoid Pressure applied Laryngoscope Size: Miller and 2 Grade View: Grade I Tube type: Oral Tube size: 7.0 mm Number of attempts: 1 Airway Equipment and Method: Stylet and Oral airway Placement Confirmation: ETT inserted through vocal cords under direct vision,  positive ETCO2 and breath sounds checked- equal and bilateral Secured at: 21 cm Tube secured with: Tape Dental Injury: Teeth and Oropharynx as per pre-operative assessment  Comments: Smooth RSI by Acey Lav-- Carignan-- cricoid-- intubation AM CRNA atraumatic--- teeth and mouth as preop-- bilat BS

## 2015-09-27 NOTE — H&P (Signed)
Haley Moses is an 40 y.o. female.   Chief Complaint: abdominal pain, nausea and vomiting starting 3 AM this morning.  HPI:  This is a healthy 40 year old woman who is 9 months postpartum. This is her third pregnancy, she's had 3 C-sections. She states she went to bed and felt fine yesterday note issues with appetite, nausea, vomiting or abdominal pain. She woke up this morning around 3 AM with acute onset of epigastric pain it's the midepigastric area. She presented to the emergency room with this problem. She has received fentanyl twice and IV Dilaudid once. Pain returns within a couple hours after the pain medicine is provided. This is the first and only time she's ever had this issue.  Workup in the emergency room shows she is afebrile and vital signs are stable. Labs are normal except for a potassium of 3.2 and a glucose of 178. She did have gestational diabetes but has not checked her sugar for some time. Her LFTs are normal lipase is 24. WBC is 9.4 with the remainder of the CBC being normal. Urinalysis is unremarkable. HCG is less than 5.0. Abdominal ultrasound sound shows multiple layering gallstones within the gallbladder the largest being 1.6 cm. No wall thickening, bile duct was 5 mm and normal. There was some hypercholic lymphadenopathy noted between the liver and the pancreas. A CT scan was then completed this shows a mildly distended gallbladder containing multiple stones within the stenosis in the neck of the gallbladder. We were asked to see.  Past Medical History:  Diagnosis Date  . Gestational diabetes    gestational  . Headache     Past Surgical History:  Procedure Laterality Date  . CESAREAN SECTION  2012, 2014    x 3    Family History  Problem Relation Age of Onset  . Early death Mother    Social History:  reports that she has never smoked. She has never used smokeless tobacco. She reports that she does not drink alcohol or use drugs. She is married. She works as a  Quarry manager at [employer]. She is 9 months postpartum. She has had 3 C-sections. EtOH: None Drugs: None Tobacco: None   Allergies: No Known Allergies  Prior to Admission medications   Medication Sig Start Date End Date Taking? Authorizing Provider  ACCU-CHEK FASTCLIX LANCETS MISC 1 each by Percutaneous route 4 (four) times daily. For testing 4 times daily. DX GDM O24.419 11/09/14   Woodroe Mode, MD  cetirizine (ZYRTEC) 10 MG tablet Take 1 tablet (10 mg total) by mouth daily. Patient not taking: Reported on 01/08/2015 06/16/14   Melony Overly, MD  docusate sodium (COLACE) 100 MG capsule Take 100 mg by mouth. Reported on 04/02/2015 12/07/14 12/07/15 She is not taking this  Historical Provider, MD  erythromycin ophthalmic ointment Place a 1/2 inch ribbon of ointment into the left lower eyelid twice a day. Patient not taking: Reported on 01/08/2015 06/16/14 Not taking   Melony Overly, MD  glucose blood (ACCU-CHEK AVIVA) test strip 1 each by Other route 4 (four) times daily. For testing 4 times daily, DX GDM O24.419 Patient not taking: Reported on 04/02/2015 11/09/14 Not performed for 3-4 months.   Woodroe Mode, MD  norethindrone (ORTHO MICRONOR) 0.35 MG tablet Take 0.35 mg by mouth. Reported on 04/02/2015 12/07/14 12/07/15  Historical Provider, MD  Prenatal Multivit-Min-Fe-FA (PRENATAL VITAMINS) 0.8 MG tablet Take 1 tablet by mouth daily. Patient not taking: Reported on 04/02/2015 02/01/15   Woodroe Mode,  MD     Results for orders placed or performed during the hospital encounter of 09/27/15 (from the past 48 hour(s))  Urinalysis, Routine w reflex microscopic     Status: Abnormal   Collection Time: 09/27/15  8:55 AM  Result Value Ref Range   Color, Urine YELLOW YELLOW   APPearance CLEAR CLEAR   Specific Gravity, Urine 1.030 1.005 - 1.030   pH 6.0 5.0 - 8.0   Glucose, UA 100 (A) NEGATIVE mg/dL   Hgb urine dipstick MODERATE (A) NEGATIVE   Bilirubin Urine NEGATIVE NEGATIVE   Ketones, ur NEGATIVE  NEGATIVE mg/dL   Protein, ur 30 (A) NEGATIVE mg/dL   Nitrite NEGATIVE NEGATIVE   Leukocytes, UA NEGATIVE NEGATIVE  Urine microscopic-add on     Status: Abnormal   Collection Time: 09/27/15  8:55 AM  Result Value Ref Range   Squamous Epithelial / LPF 0-5 (A) NONE SEEN   WBC, UA 0-5 0 - 5 WBC/hpf   RBC / HPF 6-30 0 - 5 RBC/hpf   Bacteria, UA FEW (A) NONE SEEN   Urine-Other MUCOUS PRESENT   Lipase, blood     Status: None   Collection Time: 09/27/15  9:37 AM  Result Value Ref Range   Lipase 24 11 - 51 U/L  Comprehensive metabolic panel     Status: Abnormal   Collection Time: 09/27/15  9:37 AM  Result Value Ref Range   Sodium 138 135 - 145 mmol/L   Potassium 3.2 (L) 3.5 - 5.1 mmol/L   Chloride 107 101 - 111 mmol/L   CO2 23 22 - 32 mmol/L   Glucose, Bld 178 (H) 65 - 99 mg/dL   BUN 8 6 - 20 mg/dL   Creatinine, Ser 0.55 0.44 - 1.00 mg/dL   Calcium 8.9 8.9 - 10.3 mg/dL   Total Protein 8.1 6.5 - 8.1 g/dL   Albumin 4.3 3.5 - 5.0 g/dL   AST 22 15 - 41 U/L   ALT 18 14 - 54 U/L   Alkaline Phosphatase 54 38 - 126 U/L   Total Bilirubin 0.2 (L) 0.3 - 1.2 mg/dL   GFR calc non Af Amer >60 >60 mL/min   GFR calc Af Amer >60 >60 mL/min    Comment: (NOTE) The eGFR has been calculated using the CKD EPI equation. This calculation has not been validated in all clinical situations. eGFR's persistently <60 mL/min signify possible Chronic Kidney Disease.    Anion gap 8 5 - 15  CBC     Status: None   Collection Time: 09/27/15  9:37 AM  Result Value Ref Range   WBC 9.4 4.0 - 10.5 K/uL   RBC 4.67 3.87 - 5.11 MIL/uL   Hemoglobin 13.1 12.0 - 15.0 g/dL   HCT 39.1 36.0 - 46.0 %   MCV 83.7 78.0 - 100.0 fL   MCH 28.1 26.0 - 34.0 pg   MCHC 33.5 30.0 - 36.0 g/dL   RDW 13.4 11.5 - 15.5 %   Platelets 294 150 - 400 K/uL  I-Stat beta hCG blood, ED     Status: None   Collection Time: 09/27/15  9:44 AM  Result Value Ref Range   I-stat hCG, quantitative <5.0 <5 mIU/mL   Comment 3            Comment:    GEST. AGE      CONC.  (mIU/mL)   <=1 WEEK        5 - 50     2 WEEKS  50 - 500     3 WEEKS       100 - 10,000     4 WEEKS     1,000 - 30,000        FEMALE AND NON-PREGNANT FEMALE:     LESS THAN 5 mIU/mL    US Abdomen Complete  Result Date: 09/27/2015 CLINICAL DATA:  Abdominal pain since 3 a.m.  Vomiting. EXAM: ABDOMEN ULTRASOUND COMPLETE COMPARISON:  None. FINDINGS: Gallbladder: Multiple layering gallstones within the gallbladder, the largest 1.6 cm. No wall thickening or sonographic Murphy sign. Common bile duct: Diameter: Normal caliber, 5 mm Liver: No focal lesion identified. Within normal limits in parenchymal echogenicity. IVC: No abnormality visualized. Pancreas: Hypoechoic nodule noted between the liver and pancreas measuring 1.9 x 1.7 x 1.3 cm. This could be a peripancreatic lymph node. Cannot completely exclude hypoechoic exophytic nodule from the pancreatic head. No ductal dilatation. Spleen: Size and appearance within normal limits. Right Kidney: Length: 10.8 cm. Echogenicity within normal limits. No mass or hydronephrosis visualized. Left Kidney: Length: 11.4 cm. Echogenicity within normal limits. No mass or hydronephrosis visualized. Abdominal aorta: No aneurysm visualized. Other findings: None. IMPRESSION: 1.9 x 1.7 x 1.3 cm hypoechoic nodule between the pancreatic head and liver. This could be the mildly prominent peripancreatic lymph node. Cannot completely exclude exophytic hypoechoic pancreatic head nodule. Recommend further evaluation with CT with IV and oral contrast. Cholelithiasis. Electronically Signed   By: Charlett Nose M.D.   On: 09/27/2015 11:05  Ct Abdomen Pelvis W Contrast  Result Date: 09/27/2015 CLINICAL DATA:  Epigastric pain with nausea and vomiting. Hypoechoic nodule between pancreatic head and liver on ultrasound from this morning. EXAM: CT ABDOMEN AND PELVIS WITH CONTRAST TECHNIQUE: Multidetector CT imaging of the abdomen and pelvis was performed using the  standard protocol following bolus administration of intravenous contrast. CONTRAST:  ISOVUE-300 IOPAMIDOL (ISOVUE-300) INJECTION 61% COMPARISON:  September 27, 2015 ultrasound of the right upper quadrant FINDINGS: Normal lung bases. No free air or free fluid. Cholelithiasis is identified with a stone in the neck of the gallbladder. There is no definitive wall thickening or pericholecystic fluid. The liver and portal vein are normal. The nodule between the pancreatic head and liver is well seen on coronal image 54 measuring 2.5 x 1.1 cm on this image. This abuts the pancreas and liver. While indeterminate, this is favored to represent a peripancreatic lymph node. The mass also demonstrated central hyper echogenicity on ultrasound suggesting a fatty hilum which would also suggest a peripancreatic node. Another oval region of soft tissue attenuation is seen in the portacaval region with central foci of high attenuation, likely calcifications. This measures up to 2.6 cm, likely a lymph node as well. Spleen, adrenal glands, kidneys, and pancreas are normal in appearance. The stomach and small bowel are normal. The colon and appendix are normal. The abdominal aorta is non aneurysmal. No adenopathy otherwise seen. The pelvis demonstrates no adenopathy or mass. The bladder is normal. No acute bony abnormalities. Delayed images demonstrate no filling defects in the upper renal collecting systems. IMPRESSION: 1. Mildly distended gallbladder containing multiple stones. One of the stones is in the neck of the gallbladder. Given the history of epigastric pain, a HIDA scan could further evaluate cystic duct patency if clinically warranted. 2. The soft tissue nodule between the pancreas and liver is nonspecific on this study but favored to represent a peripancreatic lymph node. There appears to be a another mildly prominent lymph node in the portacaval region containing  calcifications as described above. These mildly prominent  nodes could be reactive. Electronically Signed   By: Dorise Bullion III M.D   On: 09/27/2015 12:48   Review of Systems  Constitutional: Negative.   HENT: Negative.   Eyes: Negative.   Respiratory: Negative.   Cardiovascular: Negative.   Gastrointestinal: Positive for abdominal pain (mid epigastric pain.), nausea and vomiting. Negative for blood in stool, constipation, diarrhea and melena.  Genitourinary: Negative.   Musculoskeletal: Negative.   Skin: Negative.   Neurological: Negative.   Endo/Heme/Allergies: Negative.   Psychiatric/Behavioral: Negative.     Blood pressure 116/65, pulse 79, temperature 98 F (36.7 C), temperature source Oral, resp. rate 18, SpO2 100 %, not currently breastfeeding. Physical Exam  Constitutional: She is oriented to person, place, and time. She appears well-developed and well-nourished. She appears distressed (no pain now, better after Dilaudid, but she knows it comes back after IV dilaudid wears off.).  HENT:  Head: Normocephalic and atraumatic.  Nose: Nose normal.  Eyes: Right eye exhibits no discharge. Left eye exhibits no discharge. No scleral icterus.  Neck: Normal range of motion. Neck supple. No JVD present. No tracheal deviation present. No thyromegaly present.  Cardiovascular: Normal rate, regular rhythm, normal heart sounds and intact distal pulses.   No murmur heard. Respiratory: Effort normal and breath sounds normal. No respiratory distress. She has no wheezes. She has no rales. She exhibits no tenderness.  GI: Soft. She exhibits no distension and no mass. There is tenderness (mid epigastric area, no pain now on palpation over GB.). There is no rebound and no guarding.  Musculoskeletal: She exhibits no edema, tenderness or deformity.  Lymphadenopathy:    She has no cervical adenopathy.  Neurological: She is alert and oriented to person, place, and time. No cranial nerve deficit.  Skin: Skin is warm and dry. No rash noted. She is not  diaphoretic. No erythema. No pallor.  Psychiatric: She has a normal mood and affect. Her behavior is normal. Judgment and thought content normal.     Assessment/Plan Gallstones with pain and one on the stones impacted in the gallbladder neck. Gestational diabetes. 9 months postpartum    Plan: Its our opinion she should undergo cholecystectomy with IOC. Patient is ready to go forward with surgery. Dr. Elinor Parkinson will see in the ED, or the holding area. Nazier Neyhart, PA-C 09/27/2015, 1:53 PM

## 2015-09-27 NOTE — Anesthesia Postprocedure Evaluation (Signed)
Anesthesia Post Note  Patient: Haley Moses  Procedure(s) Performed: Procedure(s) (LRB): LAPAROSCOPIC CHOLECYSTECTOMY WITH INTRAOPERATIVE CHOLANGIOGRAM (N/A)  Patient location during evaluation: PACU Anesthesia Type: General Level of consciousness: awake and alert Pain management: pain level controlled Vital Signs Assessment: post-procedure vital signs reviewed and stable Respiratory status: spontaneous breathing, nonlabored ventilation, respiratory function stable and patient connected to nasal cannula oxygen Cardiovascular status: blood pressure returned to baseline and stable Postop Assessment: no signs of nausea or vomiting Anesthetic complications: no    Last Vitals:  Vitals:   09/27/15 1337 09/27/15 1726  BP: 116/65   Pulse: 79   Resp: 18   Temp:  36.6 C    Last Pain:  Vitals:   09/27/15 1800  TempSrc:   PainSc: 5                  Phillips Grout

## 2015-09-27 NOTE — ED Notes (Signed)
Pt states she began having epigastric pain with N/V this am.  Patient states pain is "inside" and not worse with palpation.  No hx of previous episodes, no known sick contacts.  Patient states she has had a toothache and has been taking medicine for that (name unknown) and she believes that may have upset her stomach.  See GI and N/V/D flowsheets for further assessment.

## 2015-09-27 NOTE — Op Note (Signed)
OPERATIVE NOTE-LAPAROSCOPIC CHOLECYSTECTOMY  Preoperative diagnosis:  Acute calculus cholecystitis  Postoperative diagnosis:  Same  Procedure: Laparoscopic cholecystectomy with cholangiogram.  Surgeon: Avel Peace, M.D.  Asst.:  Feliciana Rossetti M.D.  Anesthesia: General  Indication:   This is a 40 year old female who presented to the emergency department with persistent right upper quadrant pain and tenderness and guarding. She continued to have the pain despite IV narcotics. Ultrasound demonstrated multiple gallstones with one gallstone impacted in the neck of the gallbladder. She is now brought to the operating room for urgent cholecystectomy.  Technique: She was brought to the operating room, placed supine on the operating table, and a general anesthetic was administered.  The abdominal wall was then sterilely prepped and draped.  A timeout was performed.    Local anesthetic (Marcaine) was infiltrated in the subumbilical region. A small subumbilical incision was made through the skin, subcutaneous tissue, fascia, and peritoneum entering the peritoneal cavity under direct vision. A pursestring suture of 0 Vicryl was placed around the edges of the fascia. A Hassan trocar was introduced into the peritoneal cavity and a pneumoperitoneum was created by insufflation of carbon dioxide gas. The laparoscope was introduced into the trocar and no underlying bleeding or organ injury was noted. He/She was then placed in the reverse Trendelenburg position with the right side tilted slightly up.  Three 5 mm trocars were then placed into the abdominal cavity under laparoscopic vision. One in the epigastric area, and 2 in the right upper quadrant area.  The gallbladder was visualized.  Acute inflammatory changes were noted. Acute omental adhesions are noted to the fundus and body of the gallbladder. These were dissected free from the gallbladder bluntly. Next, the fundus was grasped and retracted toward the  right shoulder.  The infundibulum was mobilized with dissection close to the gallbladder and retracted laterally. The cystic duct was identified and a window was created around it. The antery cystic artery was also identified and a window was created around it. It was clipped and divided so I could have a better view of the cystic duct. The critical view was achieved. A clip was placed at the neck of the gallbladder. A small incision was made in the cystic duct. A cholangiocatheter was introduced through the anterior abdominal wall and placed in the cystic duct. A intraoperative cholangiogram was then performed.  Under real-time fluoroscopy, dilute contrast was injected into the cystic duct.  The common hepatic duct, the right and left hepatic ducts, and the common duct were all visualized. Contrast drained into the duodenum without obvious evidence of any obstructing ductal lesion. The final report is pending the Radiologist's interpretation.  The cholangiocatheter was removed, the cystic duct was clipped 3 times on the biliary side, and then the cystic duct was divided sharply. No bile leak was noted from the cystic duct stump.  The posterior branch of the cystic artery was then isolated,  Clipped, and divided. Following this the gallbladder was dissected free from the liver using electrocautery. The gallbladder was then placed in a retrieval bag and removed from the abdominal cavity through the subumbilical incision.  The gallbladder fossa was inspected, irrigated, and bleeding was controlled with electrocautery. Inspection showed that hemostasis was adequate and there was no evidence of bile leak.  The irrigation fluid was evacuated as much as possible.  Surgicel was applied to the gallbladder fossa.  The subumbilical trocar was removed and the fascial defect was closed by tightening and tying down the pursestring suture under laparoscopic  vision.  The remaining trocars were removed and the  pneumoperitoneum was released. The skin incisions were closed with 4-0 Monocryl subcuticular stitches. Steri-Strips and sterile dressings were applied.  The procedure was well-tolerated without any apparent complications. She was taken to the recovery room in satisfactory condition.

## 2015-09-27 NOTE — ED Provider Notes (Signed)
[facility]-EMERGENCY DEPT Provider Note   CSN: [id number] Arrival date & time: 09/27/15  4098  First Provider Contact:  First MD Initiated Contact with Patient 09/27/15 [phone]        History   Chief Complaint Chief Complaint  Patient presents with  . Abdominal Pain  . Emesis    HPI Haley Moses is a 40 y.o. female.  She presents for evaluation of acute onset of epigastric pain, severe, at 3 AM this morning, which awoke her from sleep. She had several episodes of vomiting "pink color", since this morning. No diarrhea, abnormal food ingestion, fever, chills, cough, shortness of breath or chest pain. She has never had this previously. There are no other known modifying factors.  HPI  Past Medical History:  Diagnosis Date  . Gestational diabetes    gestational  . Headache     Patient Active Problem List   Diagnosis Date Noted  . Symptomatic cholelithiasis 09/27/2015  . Acute calculous cholecystitis 09/27/2015  . Gestational diabetes mellitus, class A1 11/13/2014  . 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 Vaginal bleeding during pregnancy, antepartum 11/10/2014  . LGA (large for gestational age) fetus 11/09/2014  . H/O: cesarean section 11/09/2014  . Obesity 11/09/2014  . Placenta previa 11/09/2014  . Advanced maternal age in multigravida     Past Surgical History:  Procedure Laterality Date  . CESAREAN SECTION  2012, 2014    x 2    OB History    Gravida Para Term Preterm AB Living   0 1 2   SAB TAB Ectopic Multiple Live Births   1 0 0 0 2       Home Medications    Prior to Admission medications   Medication Sig Start Date End Date Taking? Authorizing Provider  ACCU-CHEK FASTCLIX LANCETS MISC 1 each by Percutaneous route 4 (four) times daily. For testing 4 times daily. DX GDM O24.419 11/09/14  Yes Adam Phenix, MD  bismuth subsalicylate (PEPTO BISMOL) 262 MG/15ML suspension Take 30 mLs by mouth every hour as needed for indigestion or diarrhea  or loose stools.   Yes Historical Provider, MD  glucose blood (ACCU-CHEK AVIVA) test strip 1 each by Other route 4 (four) times daily. For testing 4 times daily, DX GDM O24.419 11/09/14  Yes Adam Phenix, MD  ibuprofen (ADVIL,MOTRIN) 200 MG tablet Take 400 mg by mouth every 4 (four) hours as needed for fever, headache, mild pain, moderate pain or cramping.   Yes Historical Provider, MD    Family History Family History  Problem Relation Age of Onset  . Early death Mother     Social History Social History  Substance Use Topics  . Smoking status: Never Smoker  . Smokeless tobacco: Never Used  . Alcohol use No     Allergies   Review of patient's allergies indicates no known allergies.   Review of Systems Review of Systems  All other systems reviewed and are negative.    Physical Exam Updated Vital Signs BP 104/70 (BP Location: Right Arm)   Pulse 79   Temp 97.8 F (36.6 C) (Oral)   Resp 16   SpO2 99%   Breastfeeding? No   Physical Exam  Constitutional: She is oriented to person, place, and time. She appears well-developed and well-nourished.  HENT:  Head: Normocephalic and atraumatic.  Eyes: Conjunctivae and EOM are normal. Pupils are equal, round, and reactive to light.  Neck: Normal range of motion and phonation normal. Neck supple.  Cardiovascular: Normal rate and regular rhythm.   Pulmonary/Chest: Effort normal and breath sounds normal. She exhibits no tenderness.  Abdominal: Soft. She exhibits no distension and no mass. There is tenderness (Epigastric, moderate). There is guarding (Epigastric). There is no rebound. No hernia.  Musculoskeletal: Normal range of motion.  Neurological: She is alert and oriented to person, place, and time. She exhibits normal muscle tone.  Skin: Skin is warm and dry.  Psychiatric: She has a normal mood and affect. Her behavior is normal. Judgment and thought content normal.  Nursing note and vitals reviewed.    ED Treatments /  Results  Labs (all labs ordered are listed, but only abnormal results are displayed) Labs Reviewed  COMPREHENSIVE METABOLIC PANEL - Abnormal; Notable for the following:       Result Value   Potassium 3.2 (*)    Glucose, Bld 178 (*)    Total Bilirubin 0.2 (*)    All other components within normal limits  URINALYSIS, ROUTINE W REFLEX MICROSCOPIC (NOT AT [facility]) - Abnormal; Notable for the following:    Glucose, UA 100 (*)    Hgb urine dipstick MODERATE (*)    Protein, ur 30 (*)    All other components within normal limits  URINE MICROSCOPIC-ADD ON - Abnormal; Notable for the following:    Squamous Epithelial / LPF 0-5 (*)    Bacteria, UA FEW (*)    All other components within normal limits  LIPASE, BLOOD  CBC  I-STAT BETA HCG BLOOD, ED ([facility], [facility], [facility] ONLY)  SURGICAL PATHOLOGY    EKG  EKG Interpretation None       Radiology Dg Cholangiogram Operative  Result Date: 09/27/2015 CLINICAL DATA:  Gallstone.  Evaluate for CBD stones. EXAM: INTRAOPERATIVE CHOLANGIOGRAM TECHNIQUE: Cholangiographic images from the C-arm fluoroscopic device were submitted for interpretation post-operatively. Please see the procedural report for the amount of contrast and the fluoroscopy time utilized. COMPARISON:  Ultrasound and CT earlier this day. FINDINGS: Contrast injected through the cystic duct remnant. Contrast is seen opacifying the common hepatic and common bile duct. Contrast freely flows into the duodenum without filling defect to suggest common duct stone. Minimal contrast also opacifies normal caliber intrahepatic ducts. IMPRESSION: Intraoperative cholangiogram demonstrating no evidence of choledocholithiasis. Electronically Signed   By: Rubye Oaks M.D.   On: 09/27/2015 23:57   US Abdomen Complete  Result Date: 09/27/2015 CLINICAL DATA:  Abdominal pain since 3 a.m.  Vomiting. EXAM: ABDOMEN ULTRASOUND COMPLETE COMPARISON:  None. FINDINGS: Gallbladder: Multiple layering gallstones within the  gallbladder, the largest 1.6 cm. No wall thickening or sonographic Murphy sign. Common bile duct: Diameter: Normal caliber, 5 mm Liver: No focal lesion identified. Within normal limits in parenchymal echogenicity. IVC: No abnormality visualized. Pancreas: Hypoechoic nodule noted between the liver and pancreas measuring 1.9 x 1.7 x 1.3 cm. This could be a peripancreatic lymph node. Cannot completely exclude hypoechoic exophytic nodule from the pancreatic head. No ductal dilatation. Spleen: Size and appearance within normal limits. Right Kidney: Length: 10.8 cm. Echogenicity within normal limits. No mass or hydronephrosis visualized. Left Kidney: Length: 11.4 cm. Echogenicity within normal limits. No mass or hydronephrosis visualized. Abdominal aorta: No aneurysm visualized. Other findings: None. IMPRESSION: 1.9 x 1.7 x 1.3 cm hypoechoic nodule between the pancreatic head and liver. This could be the mildly prominent peripancreatic lymph node. Cannot completely exclude exophytic hypoechoic pancreatic head nodule. Recommend further evaluation with CT with IV and oral contrast. Cholelithiasis. Electronically Signed   By: Charlett Nose M.D.  On: 09/27/2015 11:05  Ct Abdomen Pelvis W Contrast  Result Date: 09/27/2015 CLINICAL DATA:  Epigastric pain with nausea and vomiting. Hypoechoic nodule between pancreatic head and liver on ultrasound from this morning. EXAM: CT ABDOMEN AND PELVIS WITH CONTRAST TECHNIQUE: Multidetector CT imaging of the abdomen and pelvis was performed using the standard protocol following bolus administration of intravenous contrast. CONTRAST:  ISOVUE-300 IOPAMIDOL (ISOVUE-300) INJECTION 61% COMPARISON:  September 27, 2015 ultrasound of the right upper quadrant FINDINGS: Normal lung bases. No free air or free fluid. Cholelithiasis is identified with a stone in the neck of the gallbladder. There is no definitive wall thickening or pericholecystic fluid. The liver and portal vein are normal. The  nodule between the pancreatic head and liver is well seen on coronal image 54 measuring 2.5 x 1.1 cm on this image. This abuts the pancreas and liver. While indeterminate, this is favored to represent a peripancreatic lymph node. The mass also demonstrated central hyper echogenicity on ultrasound suggesting a fatty hilum which would also suggest a peripancreatic node. Another oval region of soft tissue attenuation is seen in the portacaval region with central foci of high attenuation, likely calcifications. This measures up to 2.6 cm, likely a lymph node as well. Spleen, adrenal glands, kidneys, and pancreas are normal in appearance. The stomach and small bowel are normal. The colon and appendix are normal. The abdominal aorta is non aneurysmal. No adenopathy otherwise seen. The pelvis demonstrates no adenopathy or mass. The bladder is normal. No acute bony abnormalities. Delayed images demonstrate no filling defects in the upper renal collecting systems. IMPRESSION: 1. Mildly distended gallbladder containing multiple stones. One of the stones is in the neck of the gallbladder. Given the history of epigastric pain, a HIDA scan could further evaluate cystic duct patency if clinically warranted. 2. The soft tissue nodule between the pancreas and liver is nonspecific on this study but favored to represent a peripancreatic lymph node. There appears to be a another mildly prominent lymph node in the portacaval region containing calcifications as described above. These mildly prominent nodes could be reactive. Electronically Signed   By: Gerome Sam III M.D   On: 09/27/2015 12:48   Procedures Procedures (including critical care time)  Medications Ordered in ED Medications  cefTRIAXone (ROCEPHIN) 2 g in dextrose 5 % 50 mL IVPB ( Intravenous MAR Unhold 09/27/15 1854)  heparin injection 5,000 Units (not administered)  dextrose 5 % and 0.9 % NaCl with KCl 20 mEq/L infusion ( Intravenous New Bag/Given 09/28/15 0639)   oxyCODONE (Oxy IR/ROXICODONE) immediate release tablet 5-10 mg (not administered)  morphine 2 MG/ML injection 2-6 mg (4 mg Intravenous Given 09/28/15 0639)  ondansetron (ZOFRAN-ODT) disintegrating tablet 4 mg (not administered)    Or  ondansetron (ZOFRAN) injection 4 mg (not administered)  fentaNYL (SUBLIMAZE) 100 MCG/2ML injection (  Not Given 09/27/15 1745)  HYDROmorphone (DILAUDID) 1 MG/ML injection (  Not Given 09/27/15 1815)  ondansetron (ZOFRAN-ODT) disintegrating tablet 4 mg (4 mg Oral Given 09/27/15 0925)  ondansetron (ZOFRAN) injection 4 mg (4 mg Intravenous Given 09/27/15 1026)  iopamidol (ISOVUE-300) 61 % injection 100 mL (100 mLs Intravenous Contrast Given 09/27/15 1201)  ondansetron (ZOFRAN) injection 4 mg (4 mg Intravenous Given 09/27/15 1334)  HYDROmorphone (DILAUDID) injection 1 mg (1 mg Intravenous Given 09/27/15 1333)     Initial Impression / Assessment and Plan / ED Course  I have reviewed the triage vital signs and the nursing notes.  Pertinent labs & imaging results that were available  during my care of the patient were reviewed by me and considered in my medical decision making (see chart for details).  Clinical Course    Medications  cefTRIAXone (ROCEPHIN) 2 g in dextrose 5 % 50 mL IVPB ( Intravenous MAR Unhold 09/27/15 1854)  heparin injection 5,000 Units (not administered)  dextrose 5 % and 0.9 % NaCl with KCl 20 mEq/L infusion ( Intravenous New Bag/Given 09/28/15 0639)  oxyCODONE (Oxy IR/ROXICODONE) immediate release tablet 5-10 mg (not administered)  morphine 2 MG/ML injection 2-6 mg (4 mg Intravenous Given 09/28/15 0639)  ondansetron (ZOFRAN-ODT) disintegrating tablet 4 mg (not administered)    Or  ondansetron (ZOFRAN) injection 4 mg (not administered)  fentaNYL (SUBLIMAZE) 100 MCG/2ML injection (  Not Given 09/27/15 1745)  HYDROmorphone (DILAUDID) 1 MG/ML injection (  Not Given 09/27/15 1815)  ondansetron (ZOFRAN-ODT) disintegrating tablet 4 mg (4 mg Oral Given  09/27/15 0925)  ondansetron (ZOFRAN) injection 4 mg (4 mg Intravenous Given 09/27/15 1026)  iopamidol (ISOVUE-300) 61 % injection 100 mL (100 mLs Intravenous Contrast Given 09/27/15 1201)  ondansetron (ZOFRAN) injection 4 mg (4 mg Intravenous Given 09/27/15 1334)  HYDROmorphone (DILAUDID) injection 1 mg (1 mg Intravenous Given 09/27/15 1333)    Patient Vitals for the past 24 hrs:  BP Temp Temp src Pulse Resp SpO2  09/28/15 0524 104/70 97.8 F (36.6 C) Oral 79 16 99 %  09/27/15 2211 104/71 98.5 F (36.9 C) Oral 85 15 99 %  09/27/15 1850 114/70 97.8 F (36.6 C) - 85 14 100 %  09/27/15 1844 108/78 97.6 F (36.4 C) - - - -  09/27/15 1726 - 97.8 F (36.6 C) - - - -  09/27/15 1337 116/65 - - 79 18 100 %  09/27/15 1124 119/70 98 F (36.7 C) Oral 68 17 100 %  09/27/15 0839 111/67 98 F (36.7 C) Oral 73 20 100 %       Final Clinical Impressions(s) / ED Diagnoses   Final diagnoses:  Gallstone    Upper abdominal pain with multiple gallstones, and a stone in gallbladder neck, likely source of ongoing pain. Patient required multiple doses of pain medication. Surgery consulted for evaluation, and likely cholecystectomy  Nursing Notes Reviewed/ Care Coordinated, and agree without changes. Applicable Imaging Reviewed.  Interpretation of Laboratory Data incorporated into ED treatment  Plan- as per general surgery  New Prescriptions Current Discharge Medication List       Mancel Bale, MD 09/28/15 [phone]

## 2015-09-28 ENCOUNTER — Encounter: Payer: Self-pay | Admitting: General Surgery

## 2015-09-28 ENCOUNTER — Encounter (HOSPITAL_COMMUNITY): Payer: Self-pay | Admitting: General Surgery

## 2015-09-28 MED ORDER — OXYCODONE HCL 5 MG PO TABS: 5.0000 mg | ORAL_TABLET | ORAL | 0 refills | Status: DC | PRN

## 2015-09-28 NOTE — Discharge Instructions (Signed)
LAPAROSCOPIC GALLBLADDER SURGERY: POST OP INSTRUCTIONS  1. DIET: Follow a liquid diet the first 24 hours after arrival home, such as soup, liquids, crackers, etc.  Be sure to include lots of fluids daily.  Avoid fast food or heavy meals as your are more likely to get nauseated.  Eat a low fat the next few days after surgery.   2. Take your usually prescribed home medications unless otherwise directed. 3. PAIN CONTROL: a. Pain is best controlled by a usual combination of three different methods TOGETHER: i. Ice/Heat ii. Over the counter pain medication iii. Prescription pain medication b. Most patients will experience some swelling and bruising around the incisions.  Ice packs or heating pads (30-60 minutes up to 6 times a day) will help. Use ice for the first few days to help decrease swelling and bruising, then switch to heat to help relax tight/sore spots and speed recovery.  Some people prefer to use ice alone, heat alone, alternating between ice & heat.  Experiment to what works for you.  Swelling and bruising can take several weeks to resolve.   c. It is helpful to take an over-the-counter pain medication regularly for the first few weeks.  Choose one of the following that works best for you: i. Naproxen (Aleve, etc)  Two  tabs twice a day ii. Ibuprofen (Advil, etc) Three  tabs four times a day (every meal & bedtime) iii. Acetaminophen (Tylenol, etc) 500-650mg  four times a day (every meal & bedtime) d. A  prescription for pain medication (such as oxycodone, hydrocodone, etc) should be given to you upon discharge.  Take your pain medication as prescribed.  i. If you are having problems/concerns with the prescription medicine (does not control pain, nausea, vomiting, rash, itching, etc), please call us [phone] to see if we need to switch you to a different pain medicine that will work better for you and/or control your side effect better. ii. If you need a refill on your pain  medication, please contact your pharmacy.  They will contact our office to request authorization. Prescriptions will not be filled after 5 pm or on week-ends. 4. Avoid getting constipated.  Between the surgery and the pain medications, it is common to experience some constipation.  Increasing fluid intake and taking a fiber supplement (such as Metamucil, Citrucel, FiberCon, MiraLax, etc) 1-2 times a day regularly will usually help prevent this problem from occurring.  A mild laxative (prune juice, Milk of Magnesia, MiraLax, etc) should be taken according to package directions if there are no bowel movements after 48 hours.   5. Watch out for diarrhea.  If you have many loose bowel movements, simplify your diet to bland foods & liquids for a few days.  Stop any stool softeners and decrease your fiber supplement.  Switching to mild anti-diarrheal medications (Kayopectate, Pepto Bismol) can help.  If this worsens or does not improve, please call us. 6. Wash / shower every day.  You may shower over the dressings as they are waterproof.  Continue to shower over incision(s) after the dressing is off. 7. Remove your waterproof bandages 3 days after surgery.  You may leave the incision open to air.  You may replace a dressing/Band-Aid to cover the incision for comfort if you wish.  8. ACTIVITIES as tolerated:   a. You may resume regular (light) daily activities beginning the next day--such as daily self-care, walking, climbing stairs--gradually increasing light activities as tolerated.  No heavy lifting (over 10 pounds), straining, or  intense activities for 2 weeks. b. DO NOT PUSH THROUGH PAIN.  Let pain be your guide: If it hurts to do something, don't do it.  Pain is your body warning you to avoid that activity for another week until the pain goes down. c. You may drive when you are no longer taking prescription pain medication, you can comfortably wear a seatbelt, and you can safely maneuver your car and apply  brakes. d. Bonita Quin may have sexual intercourse when it is comfortable.  9. FOLLOW UP in our office a. Please call [facility] at [phone] to set up an appointment  in the office for a follow-up appointment approximately 2-3 weeks after your surgery. b. Make sure that you call for this appointment the day you arrive home to insure a convenient appointment time. 10. IF YOU HAVE DISABILITY OR FAMILY LEAVE FORMS, BRING THEM TO THE OFFICE FOR PROCESSING.  DO NOT GIVE THEM TO YOUR DOCTOR.  11.  Return to work/school:  In 2 weeks if pain-free.   WHEN TO CALL us [phone]: 1. Poor pain control 2. Reactions / problems with new medications (rash/itching, nausea, etc)  3. Fever over 101.5 F (38.5 C) 4. Inability to urinate 5. Nausea and/or vomiting 6. Worsening swelling or bruising 7. Continued bleeding from incision. 8. Increased pain, redness, or drainage from the incision   The clinic staff is available to answer your questions during regular business hours (8:30am-5pm).  Please dont hesitate to call and ask to speak to one of our nurses for clinical concerns.   If you have a medical emergency, go to the nearest emergency room or call 911.  A surgeon from [facility] is always on call at the [facility], [place] [address], [address], [place], [place]  [address] ? MAIN: [phone] ? TOLL FREE: [phone] ?  FAX [phone] [email or web address]

## 2015-09-28 NOTE — Discharge Summary (Signed)
Physician Discharge Summary  Patient ID: Haley Moses MRN: [id number] DOB/AGE: Jun 04, 1975 40 y.o.  Admit date: 09/27/2015 Discharge date: 09/28/2015  Admission Diagnoses:  Acute calculous cholecystitis  Discharge Diagnoses:    Acute calculous cholecystitis s/p laparoscopic cholecystectomy with [facility] 09/27/15   Discharged Condition: good  Hospital Course: She was admitted, started on IV antibiotics and taken to the OR for the above procedure which she tolerated well.  She was tolerating a liquid diet and ambulating on POD #1.  She was able to be discharged.  Discharge instructions were given to her.  C Discharge Exam: Blood pressure 104/70, pulse 79, temperature 97.8 F (36.6 C), temperature source Oral, resp. rate 16, SpO2 99 %, not currently breastfeeding. Abd-soft, dressings dry  Disposition:   Home     Medication List    STOP taking these medications   bismuth subsalicylate 262 MG/15ML suspension Commonly known as:  PEPTO BISMOL     TAKE these medications   ACCU-CHEK FASTCLIX LANCETS Misc 1 each by Percutaneous route 4 (four) times daily. For testing 4 times daily. DX GDM O24.419   glucose blood test strip Commonly known as:  ACCU-CHEK AVIVA 1 each by Other route 4 (four) times daily. For testing 4 times daily, DX GDM O24.419   ibuprofen 200 MG tablet Commonly known as:  ADVIL,MOTRIN Take 400 mg by mouth every 4 (four) hours as needed for fever, headache, mild pain, moderate pain or cramping.   oxyCODONE 5 MG immediate release tablet Commonly known as:  Oxy IR/ROXICODONE Take 1-2 tablets (5-10 mg total) by mouth every 4 (four) hours as needed for moderate pain.      Follow-up Information    [facility] .   Specialty:  General Surgery Contact information: [address] [place] [address] [phone]           Signed: Adolph Pollack 09/28/2015, 9:36 AM

## 2016-12-07 ENCOUNTER — Other Ambulatory Visit: Payer: Self-pay | Admitting: Obstetrics & Gynecology

## 2016-12-07 DIAGNOSIS — Z1231 Encounter for screening mammogram for malignant neoplasm of breast: Secondary | ICD-10-CM

## 2017-06-22 IMAGING — CT CT ABD-PELV W/ CM
2 of 5 series · 15 of 46 positions shown, 17 images · IV contrast (ISOVUE)
Comparison: September 27, 2015 ultrasound of the right upper quadrant

CLINICAL DATA: Epigastric pain with nausea and vomiting. Hypoechoic
nodule between pancreatic head and liver on ultrasound from this
morning.

EXAM:
CT ABDOMEN AND PELVIS WITH CONTRAST
TECHNIQUE: Multidetector CT imaging of the abdomen and pelvis was performed
using the standard protocol following bolus administration of
intravenous contrast.
CONTRAST:  100mL [id number] IOPAMIDOL ([id number]) INJECTION 61%

[Series 2: abd/pel with · axial · 0.74mm/px · z∈[+1055,+1465]mm · 12 of 98 slices shown, 14 images]
[im 8/98  soft-tissue]
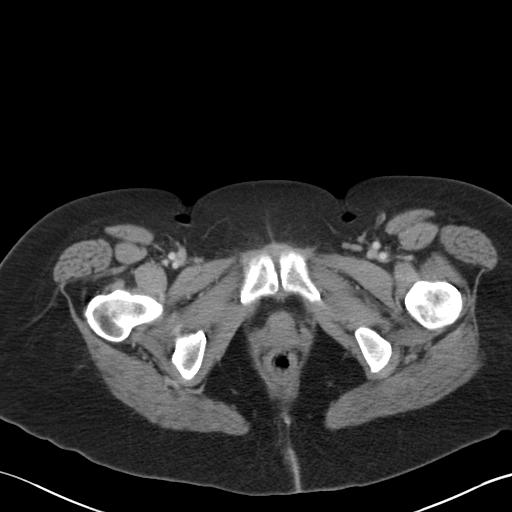
[im 8/98  bone]
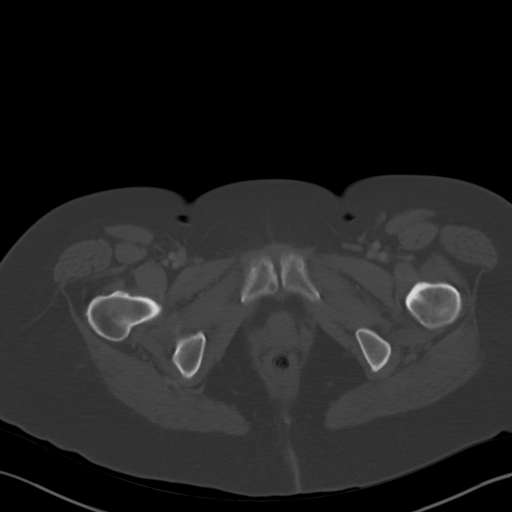
[im 15/98  soft-tissue]
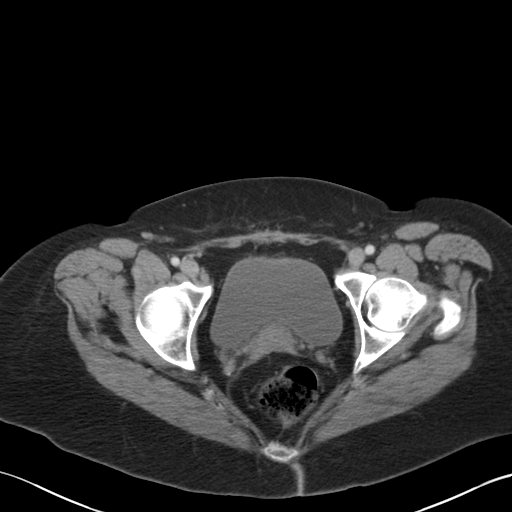
[im 23/98  soft-tissue]
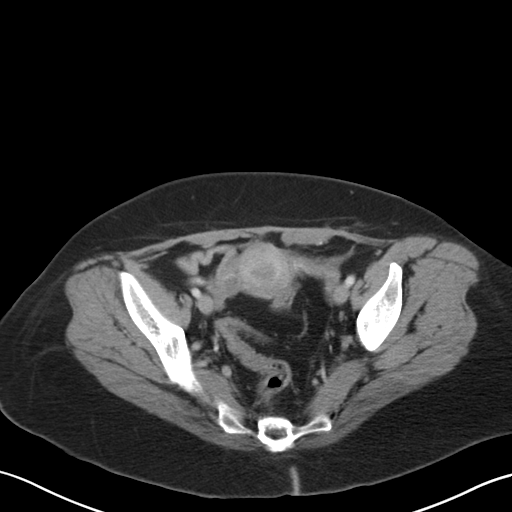
[im 30/98  soft-tissue]
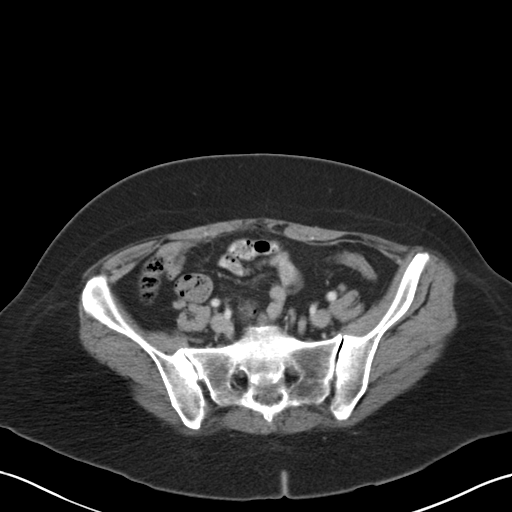
[im 38/98  soft-tissue]
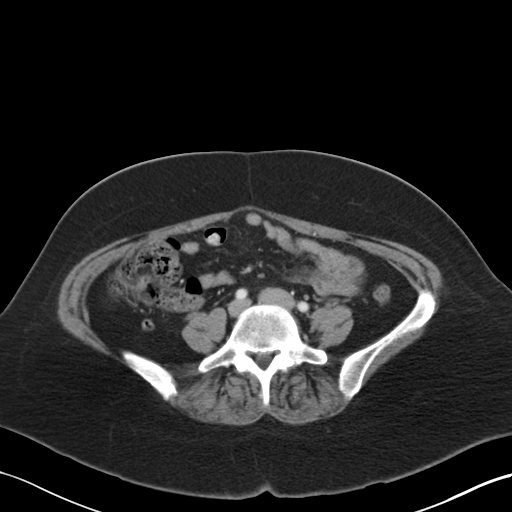
[im 45/98  soft-tissue]
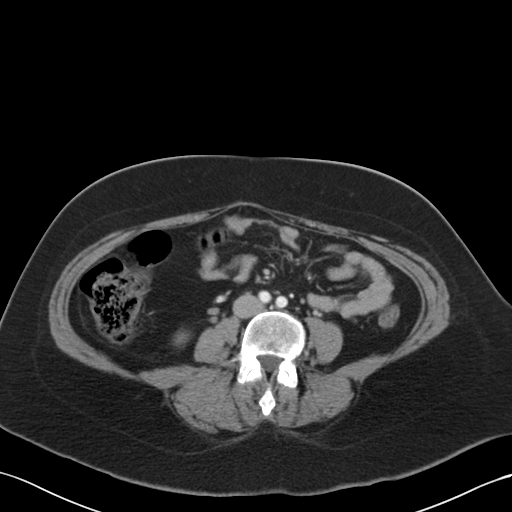
[im 53/98  soft-tissue]
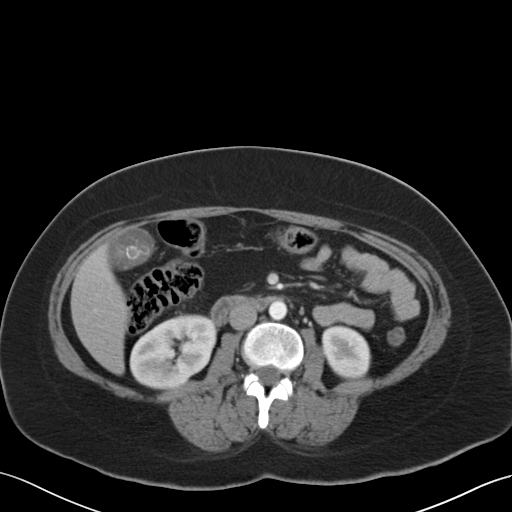
[im 60/98  soft-tissue]
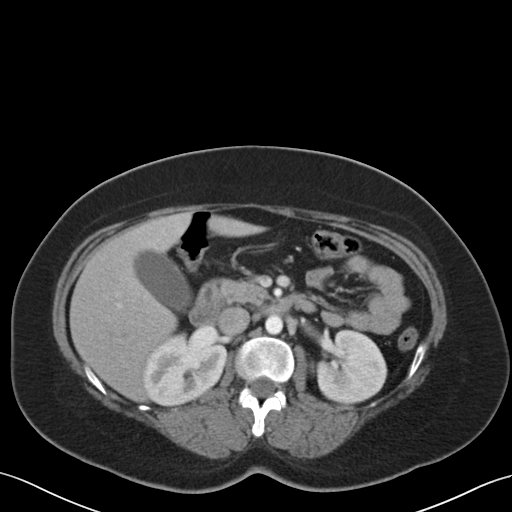
[im 68/98  soft-tissue]
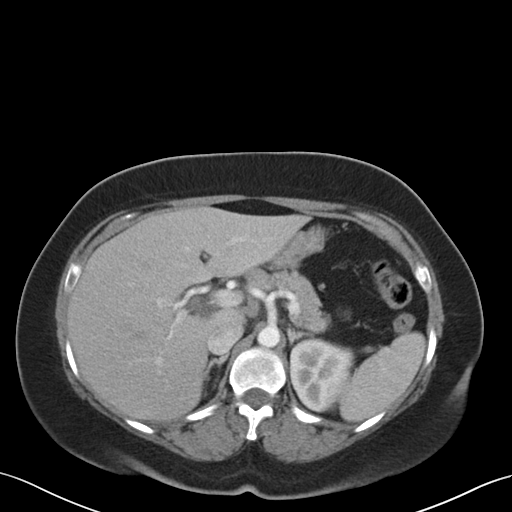
[im 68/98  bone]
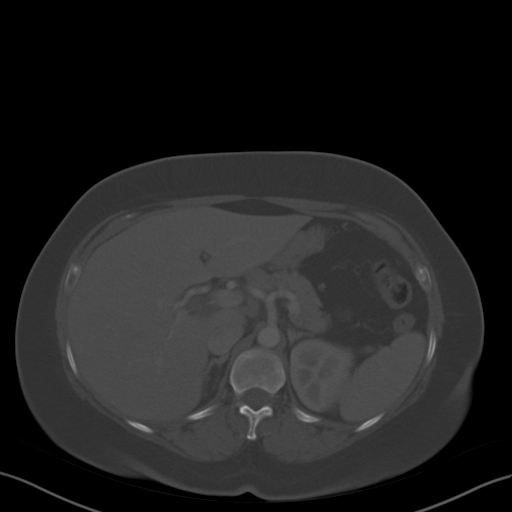
[im 75/98  soft-tissue]
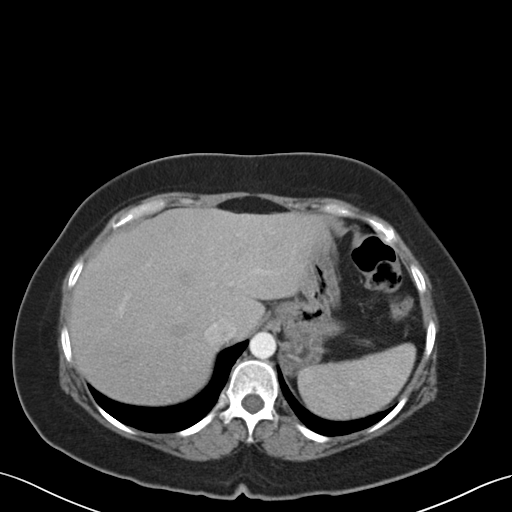
[im 83/98  soft-tissue]
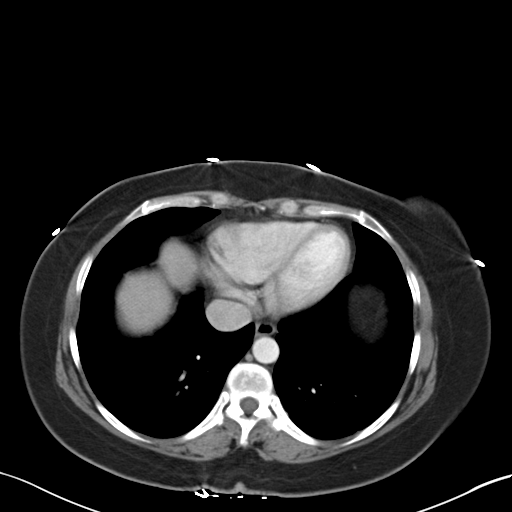
[im 90/98  soft-tissue]
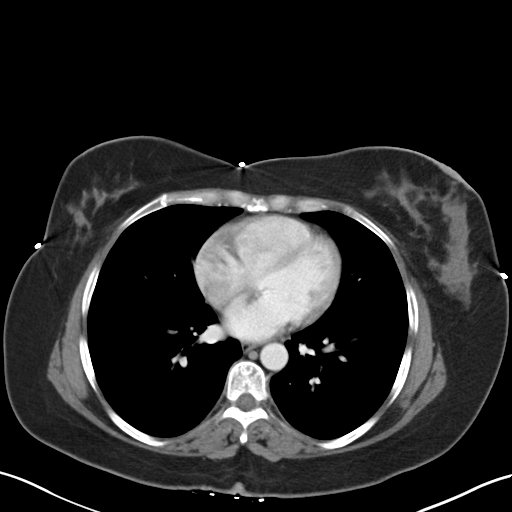

[Series 5: coronal a/|p · coronal · 0.74mm/px · 3 of 133 slices shown]
[im 45/133  soft-tissue]
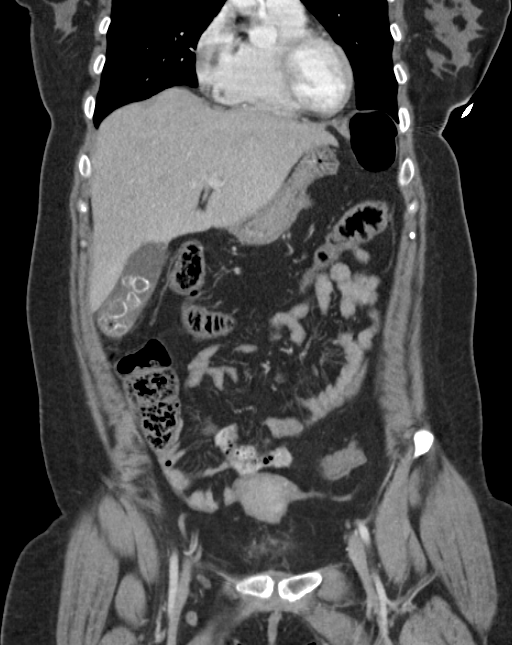
[im 59/133  soft-tissue]
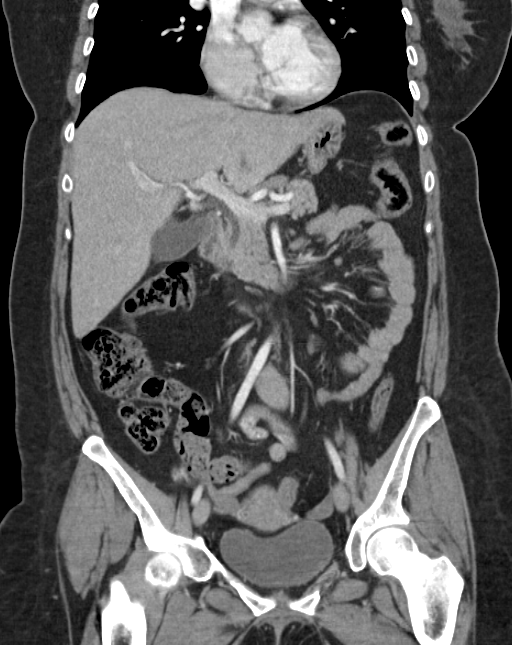
[im 74/133  soft-tissue]
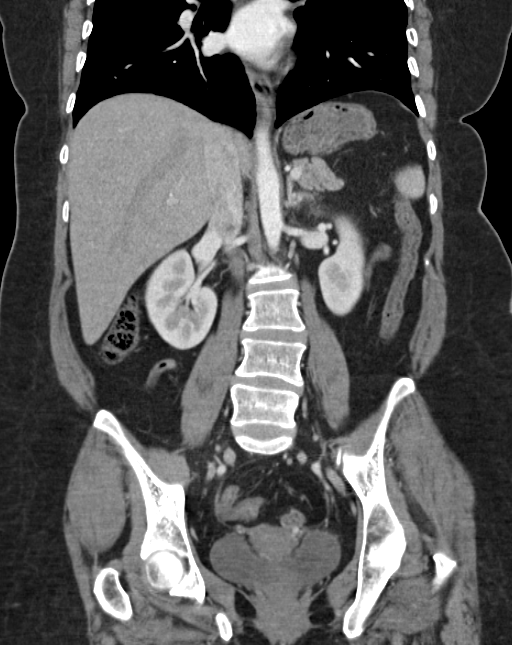

[15 of 46 positions shown; findings below may reference images not displayed]

FINDINGS: Normal lung bases.

No free air or free fluid. Cholelithiasis is identified with a stone
in the neck of the gallbladder. There is no definitive wall
thickening or pericholecystic fluid. The liver and portal vein are
normal. The nodule between the pancreatic head and liver is well
seen on coronal image 54 measuring 2.5 x 1.1 cm on this image. This
abuts the pancreas and liver. While indeterminate, this is favored
to represent a peripancreatic lymph node. The mass also demonstrated
central hyper echogenicity on ultrasound suggesting a fatty hilum
which would also suggest a peripancreatic node. Another oval region
of soft tissue attenuation is seen in the portacaval region with
central foci of high attenuation, likely calcifications. This
measures up to 2.6 cm, likely a lymph node as well. Spleen, adrenal
glands, kidneys, and pancreas are normal in appearance. The stomach
and small bowel are normal. The colon and appendix are normal. The
abdominal aorta is non aneurysmal. No adenopathy otherwise seen.

The pelvis demonstrates no adenopathy or mass. The bladder is
normal.

No acute bony abnormalities. Delayed images demonstrate no filling
defects in the upper renal collecting systems.
IMPRESSION: 1. Mildly distended gallbladder containing multiple stones. One of
the stones is in the neck of the gallbladder. Given the history of
epigastric pain, a HIDA scan could further evaluate cystic duct
patency if clinically warranted.
2. The soft tissue nodule between the pancreas and liver is
nonspecific on this study but favored to represent a peripancreatic
lymph node. There appears to be a another mildly prominent lymph
node in the portacaval region containing calcifications as described
above. These mildly prominent nodes could be reactive.

## 2017-12-31 ENCOUNTER — Encounter (HOSPITAL_COMMUNITY): Payer: Self-pay | Admitting: Emergency Medicine

## 2017-12-31 ENCOUNTER — Ambulatory Visit (INDEPENDENT_AMBULATORY_CARE_PROVIDER_SITE_OTHER): Payer: BLUE CROSS/BLUE SHIELD

## 2017-12-31 ENCOUNTER — Ambulatory Visit (HOSPITAL_COMMUNITY)
Admission: EM | Admit: 2017-12-31 | Discharge: 2017-12-31 | Disposition: A | Discharge status: Home / Self Care | Payer: BLUE CROSS/BLUE SHIELD | Source: Home / Self Care

## 2017-12-31 ENCOUNTER — Other Ambulatory Visit: Payer: Self-pay

## 2017-12-31 ENCOUNTER — Emergency Department (HOSPITAL_COMMUNITY)
Admission: EM | Admit: 2017-12-31 | Discharge: 2018-01-01 | Disposition: A | Discharge status: Home / Self Care | Payer: BLUE CROSS/BLUE SHIELD | Attending: Emergency Medicine | Admitting: Emergency Medicine

## 2017-12-31 ENCOUNTER — Emergency Department (HOSPITAL_COMMUNITY): Payer: BLUE CROSS/BLUE SHIELD

## 2017-12-31 DIAGNOSIS — Z79899 Other long term (current) drug therapy: Secondary | ICD-10-CM | POA: Insufficient documentation

## 2017-12-31 DIAGNOSIS — R079 Chest pain, unspecified: Secondary | ICD-10-CM

## 2017-12-31 DIAGNOSIS — R05 Cough: Secondary | ICD-10-CM

## 2017-12-31 DIAGNOSIS — R1013 Epigastric pain: Secondary | ICD-10-CM

## 2017-12-31 DIAGNOSIS — R0789 Other chest pain: Secondary | ICD-10-CM | POA: Diagnosis not present

## 2017-12-31 DIAGNOSIS — J069 Acute upper respiratory infection, unspecified: Secondary | ICD-10-CM | POA: Diagnosis not present

## 2017-12-31 DIAGNOSIS — R059 Cough, unspecified: Secondary | ICD-10-CM

## 2017-12-31 LAB — I-STAT BETA HCG BLOOD, ED (MC, WL, AP ONLY): I-stat hCG, quantitative: <5 m[IU]/mL (ref <5)

## 2017-12-31 LAB — BASIC METABOLIC PANEL
ANION GAP: 8 (ref 5–15)
BUN: 5 mg/dL — AB (ref 6–20)
CHLORIDE: 103 mmol/L (ref 98–111)
CO2: 25 mmol/L (ref 22–32)
Calcium: 9.1 mg/dL (ref 8.9–10.3)
Creatinine, Ser: 0.52 mg/dL (ref 0.44–1.00)
GFR calc non Af Amer: >60 mL/min (ref >60)
GLUCOSE: 129 mg/dL — AB (ref 70–99)
POTASSIUM: 3.4 mmol/L — AB (ref 3.5–5.1)
SODIUM: 136 mmol/L (ref 135–145)

## 2017-12-31 LAB — I-STAT TROPONIN, ED: TROPONIN I, POC: 0 ng/mL (ref 0.00–0.08)

## 2017-12-31 LAB — CBC
HEMATOCRIT: 40.1 % (ref 36.0–46.0)
HEMOGLOBIN: 13.2 g/dL (ref 12.0–15.0)
MCH: 28.1 pg (ref 26.0–34.0)
MCHC: 32.9 g/dL (ref 30.0–36.0)
MCV: 85.3 fL (ref 80.0–100.0)
Platelets: 248 10*3/uL (ref 150–400)
RBC: 4.7 MIL/uL (ref 3.87–5.11)
RDW: 12.4 % (ref 11.5–15.5)
WBC: 9.9 10*3/uL (ref 4.0–10.5)
nRBC: 0 % (ref 0.0–0.2)

## 2017-12-31 MED ORDER — ONDANSETRON 4 MG PO TBDP: 4.0000 mg | ORAL_TABLET | Freq: Three times a day (TID) | ORAL | 0 refills | Status: AC | PRN

## 2017-12-31 MED ORDER — LIDOCAINE VISCOUS HCL 2 % MT SOLN
15.0000 mL | Freq: Once | OROMUCOSAL | Status: AC
  Administered 2017-12-31: 15 mL via ORAL

## 2017-12-31 MED ORDER — LIDOCAINE VISCOUS HCL 2 % MT SOLN
OROMUCOSAL | Status: AC
  Filled 2017-12-31: qty 15

## 2017-12-31 MED ORDER — ALUM & MAG HYDROXIDE-SIMETH 200-200-20 MG/5ML PO SUSP
ORAL | Status: AC
  Filled 2017-12-31: qty 30

## 2017-12-31 MED ORDER — OMEPRAZOLE 20 MG PO CPDR: 20.0000 mg | DELAYED_RELEASE_CAPSULE | Freq: Every day | ORAL | 0 refills | Status: AC

## 2017-12-31 MED ORDER — ALUM & MAG HYDROXIDE-SIMETH 200-200-20 MG/5ML PO SUSP
30.0000 mL | Freq: Once | ORAL | Status: AC
  Administered 2017-12-31: 30 mL via ORAL

## 2017-12-31 MED ORDER — ALBUTEROL SULFATE HFA 108 (90 BASE) MCG/ACT IN AERS: 1.0000 | INHALATION_SPRAY | Freq: Four times a day (QID) | RESPIRATORY_TRACT | 0 refills | Status: AC | PRN

## 2017-12-31 NOTE — Discharge Instructions (Signed)
May go to ER for further evaluation and treatment of your chest pain.

## 2017-12-31 NOTE — ED Triage Notes (Signed)
Pt c/o coughing since yesterday with back pain and chest pain with cough.

## 2017-12-31 NOTE — ED Provider Notes (Signed)
[facility]    CSN: [id number] Arrival date & time: 12/31/17  1830     History   Chief Complaint Chief Complaint  Patient presents with  . Cough    HPI Haley Moses is a 42 y.o. female.   Haley Moses presents with complaints of sternal chest pain and upper back pain which started this afternoon, as well as cough. Chest pain is not exertional. Cough is productive of mucus. Feels some shortness of breath  And increased pain with deep breathing. Upper abdominal pain. She vomited this afternoon after eating lunch of collards and avocado. Denies current nausea. No other URI symptoms, no sore throat, fevers, ear pain. She has had her gallbladder removed. States has had intermittent similar symptoms since then. Normal BMs. Sometimes nausea after eating, denies currently. Hasn't taken any medications for symptoms. No diaphoresis. No arm or jaw pain. No cardiac history. No family cardiac history. Doesn't smoke. No high cholesterol. Doesn't have a PCP.     ROS per HPI.      Past Medical History:  Diagnosis Date  . Gestational diabetes    gestational  . Headache     Patient Active Problem List   Diagnosis Date Noted  . Symptomatic cholelithiasis 09/27/2015  . Acute calculous cholecystitis 09/27/2015  . Gestational diabetes mellitus, class A1 11/13/2014  . 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 Vaginal bleeding during pregnancy, antepartum 11/10/2014  . LGA (large for gestational age) fetus 11/09/2014  . H/O: cesarean section 11/09/2014  . Obesity 11/09/2014  . Placenta previa 11/09/2014  . Advanced maternal age in multigravida     Past Surgical History:  Procedure Laterality Date  . CESAREAN SECTION  2012, 2014    x 2  . CHOLECYSTECTOMY N/A 09/27/2015   Procedure: LAPAROSCOPIC CHOLECYSTECTOMY WITH INTRAOPERATIVE CHOLANGIOGRAM;  Surgeon: Avel Peace, MD;  Location: [facility];  Service: General;  Laterality: N/A;    OB History    Gravida  4   Para  2   Term  2   Preterm  0   AB  1   Living  2     SAB  1   TAB  0   Ectopic  0   Multiple  0   Live Births  2            Home Medications    Prior to Admission medications   Medication Sig Start Date End Date Taking? Authorizing Provider  ACCU-CHEK FASTCLIX LANCETS MISC 1 each by Percutaneous route 4 (four) times daily. For testing 4 times daily. DX GDM O24.419 11/09/14   Adam Phenix, MD  albuterol [facility] HFA) [phone] Base) MCG/ACT inhaler Inhale 1-2 puffs into the lungs every 6 (six) hours as needed for wheezing or shortness of breath. 12/31/17   Linus Mako B, NP  glucose blood (ACCU-CHEK AVIVA) test strip 1 each by Other route 4 (four) times daily. For testing 4 times daily, DX GDM O24.419 11/09/14   Adam Phenix, MD  ibuprofen (ADVIL,MOTRIN) 200 MG tablet Take 400 mg by mouth every 4 (four) hours as needed for fever, headache, mild pain, moderate pain or cramping.    [provider]  omeprazole (PRILOSEC) 20 MG capsule Take 1 capsule (20 mg total) by mouth daily. 12/31/17   Georgetta Haber, NP  ondansetron (ZOFRAN-ODT) 4 MG disintegrating tablet Take 1 tablet (4 mg total) by mouth every 8 (eight) hours as needed for nausea or vomiting. 12/31/17   Georgetta Haber, NP  oxyCODONE (OXY IR/ROXICODONE) 5 MG immediate release tablet Take 1-2 tablets (5-10 mg total) by mouth every 4 (four) hours as needed for moderate pain. Patient not taking: Reported on 12/31/2017 09/28/15   Avel Peace, MD    Family History Family History  Problem Relation Age of Onset  . Early death Mother     Social History Social History   Tobacco Use  . Smoking status: Never Smoker  . Smokeless tobacco: Never Used  Substance Use Topics  . Alcohol use: No  . Drug use: No     Allergies   Patient has no known allergies.   Review of Systems Review of Systems   Physical Exam Triage Vital Signs ED Triage Vitals  Enc Vitals Group     BP 12/31/17 1929 116/78      Pulse Rate 12/31/17 1927 98     Resp 12/31/17 1927 16     Temp 12/31/17 1927 99.3 F (37.4 C)     Temp src --      SpO2 12/31/17 1927 100 %     Weight --      Height --      Head Circumference --      Peak Flow --      Pain Score 12/31/17 1929 8     Pain Loc --      Pain Edu? --      Excl. in GC? --    No data found.  Updated Vital Signs BP 116/78   Pulse 98   Temp 99.3 F (37.4 C)   Resp 16   SpO2 100%    Physical Exam  Constitutional: She is oriented to person, place, and time. She appears well-developed and well-nourished. No distress.  HENT:  Head: Normocephalic and atraumatic.  Right Ear: Tympanic membrane, external ear and ear canal normal.  Left Ear: Tympanic membrane, external ear and ear canal normal.  Nose: Nose normal.  Mouth/Throat: Uvula is midline, oropharynx is clear and moist and mucous membranes are normal. No tonsillar exudate.  Eyes: Pupils are equal, round, and reactive to light. Conjunctivae and EOM are normal.  Cardiovascular: Normal rate, regular rhythm and normal heart sounds.  Pulmonary/Chest: Effort normal and breath sounds normal.  Abdominal: Soft. Bowel sounds are normal. There is tenderness in the right upper quadrant, epigastric area and left upper quadrant. There is no rigidity, no rebound, no guarding, no CVA tenderness, no tenderness at McBurney's point and negative Murphy's sign.  Neurological: She is alert and oriented to person, place, and time.  Skin: Skin is warm and dry.   EKG sinus tach, noted slight stelevation in v1. Reviewed with dr. Delton See  UC Treatments / Results  Labs (all labs ordered are listed, but only abnormal results are displayed) Labs Reviewed - No data to display  EKG None  Radiology Dg Chest 2 View  Result Date: 12/31/2017 CLINICAL DATA:  Two-day history of cough, chest pain and low-grade fever. EXAM: CHEST - 2 VIEW COMPARISON:  None. FINDINGS: Cardiomediastinal silhouette unremarkable. Mild central  peribronchial thickening. Lungs otherwise clear. No localized airspace consolidation. No pleural effusions. No pneumothorax. Normal pulmonary vascularity. Visualized bony thorax intact. IMPRESSION: Mild changes of bronchitis and/or asthma without focal airspace pneumonia. Electronically Signed   By: Hulan Saas M.D.   On: 12/31/2017 19:55    Procedures Procedures (including critical care time)  Medications Ordered in UC Medications  alum & mag hydroxide-simeth (MAALOX/MYLANTA) 200-200-20 MG/5ML suspension 30 mL (30 mLs Oral Given 12/31/17 1940)  And  lidocaine (XYLOCAINE) 2 % viscous mouth solution 15 mL (15 mLs Oral Given 12/31/17 1940)    Initial Impression / Assessment and Plan / UC Course  I have reviewed the triage vital signs and the nursing notes.  Pertinent labs & imaging results that were available during my care of the patient were reviewed by me and considered in my medical decision making (see chart for details).     Benign URI exam. Upper abdominal pain on palpation with chest pain, vomiting after eating. Intermittent symptoms, tend to be related to eating. No current nausea. No palpitations. Chest pain which radiates to back. No improvement with GI cocktail. Slight elevation in v1 on ekg. Patient states pain is severe, she would like to go to ER. Safe for self transport to ed for further evaluation.  Final Clinical Impressions(s) / UC Diagnoses   Final diagnoses:  Chest pain, unspecified type  Cough  Abdominal pain, epigastric     Discharge Instructions     Use of inhaler as needed for cough, wheezing, chest tightness. Please start omeprazole daily.  Zofran as needed for nausea.  Please establish with a primary care provider as may need further evaluation/treatment/referral for persistent symptoms.     ED Prescriptions    Medication Sig Dispense Auth. Provider   omeprazole (PRILOSEC) 20 MG capsule Take 1 capsule (20 mg total) by mouth daily. 30 capsule  Jaymie Misch B, NP   ondansetron (ZOFRAN-ODT) 4 MG disintegrating tablet Take 1 tablet (4 mg total) by mouth every 8 (eight) hours as needed for nausea or vomiting. 12 tablet Linus Mako B, NP   albuterol (PROAIR HFA) 108 (90 Base) MCG/ACT inhaler Inhale 1-2 puffs into the lungs every 6 (six) hours as needed for wheezing or shortness of breath. 1 Inhaler Georgetta Haber, NP     Controlled Substance Prescriptions [place] Controlled Substance Registry consulted? Not Applicable   Georgetta Haber, NP 12/31/17 2020

## 2017-12-31 NOTE — ED Triage Notes (Signed)
Pt arrives with c/o chest pain and back pain starting today, cough for 2 days. Cough is congested, with mucous production. Denies fevers, shortness of breath

## 2018-01-01 MED ORDER — ALBUTEROL SULFATE HFA 108 (90 BASE) MCG/ACT IN AERS
2.0000 | INHALATION_SPRAY | Freq: Once | RESPIRATORY_TRACT | Status: AC
  Administered 2018-01-01: 2 via RESPIRATORY_TRACT
  Filled 2018-01-01: qty 6.7

## 2018-01-01 MED ORDER — IPRATROPIUM-ALBUTEROL 0.5-2.5 (3) MG/3ML IN SOLN
3.0000 mL | Freq: Once | RESPIRATORY_TRACT | Status: AC
  Administered 2018-01-01: 3 mL via RESPIRATORY_TRACT
  Filled 2018-01-01: qty 3

## 2018-01-01 NOTE — ED Provider Notes (Signed)
[facility] EMERGENCY DEPARTMENT Provider Note   CSN: [id number] Arrival date & time: 12/31/17  2032     History   Chief Complaint Chief Complaint  Patient presents with  . Chest Pain  . Back Pain  . Cough    HPI Haley Moses is a 42 y.o. female with a past medical history of gestational diabetes, status post cholecystectomy who presents today for evaluation of chest, back pain and cough.  Ports that for the past 2 days she has had worsening cough, nasal congestion postnasal drip, and runny nose.  Today approximately 2 hours prior to arrival this developed into chest pain.  She reports her chest hurts when she coughs.  Her cough is not productive.  No fevers.  She was initially seen at urgent care, they sent prescriptions for her for albuterol, nausea medicine, GERD medicine and advised her to come here.  HPI  Past Medical History:  Diagnosis Date  . Gestational diabetes    gestational  . Headache     Patient Active Problem List   Diagnosis Date Noted  . Symptomatic cholelithiasis 09/27/2015  . Acute calculous cholecystitis 09/27/2015  . Gestational diabetes mellitus, class A1 11/13/2014  . 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 Vaginal bleeding during pregnancy, antepartum 11/10/2014  . LGA (large for gestational age) fetus 11/09/2014  . H/O: cesarean section 11/09/2014  . Obesity 11/09/2014  . Placenta previa 11/09/2014  . Advanced maternal age in multigravida     Past Surgical History:  Procedure Laterality Date  . CESAREAN SECTION  2012, 2014    x 2  . CHOLECYSTECTOMY N/A 09/27/2015   Procedure: LAPAROSCOPIC CHOLECYSTECTOMY WITH INTRAOPERATIVE CHOLANGIOGRAM;  Surgeon: Avel Peace, MD;  Location: [facility];  Service: General;  Laterality: N/A;     OB History    Gravida  4   Para  2   Term  2   Preterm  0   AB  1   Living  2     SAB  1   TAB  0   Ectopic  0   Multiple  0   Live Births  2             Home Medications    Prior to Admission medications   Medication Sig Start Date End Date Taking? Authorizing Provider  ACCU-CHEK FASTCLIX LANCETS MISC 1 each by Percutaneous route 4 (four) times daily. For testing 4 times daily. DX GDM O24.419 11/09/14   Adam Phenix, MD  albuterol [facility] HFA) 108 [phone] Base) MCG/ACT inhaler Inhale 1-2 puffs into the lungs every 6 (six) hours as needed for wheezing or shortness of breath. 12/31/17   Linus Mako B, NP  glucose blood (ACCU-CHEK AVIVA) test strip 1 each by Other route 4 (four) times daily. For testing 4 times daily, DX GDM O24.419 11/09/14   Adam Phenix, MD  ibuprofen (ADVIL,MOTRIN) 200 MG tablet Take 400 mg by mouth every 4 (four) hours as needed for fever, headache, mild pain, moderate pain or cramping.    [provider]  omeprazole (PRILOSEC) 20 MG capsule Take 1 capsule (20 mg total) by mouth daily. 12/31/17   Georgetta Haber, NP  ondansetron (ZOFRAN-ODT) 4 MG disintegrating tablet Take 1 tablet (4 mg total) by mouth every 8 (eight) hours as needed for nausea or vomiting. 12/31/17   Linus Mako B, NP  oxyCODONE (OXY IR/ROXICODONE) 5 MG immediate release tablet Take 1-2 tablets (5-10 mg total) by mouth every 4 (  four) hours as needed for moderate pain. Patient not taking: Reported on 12/31/2017 09/28/15   Avel Peace, MD    Family History Family History  Problem Relation Age of Onset  . Early death Mother     Social History Social History   Tobacco Use  . Smoking status: Never Smoker  . Smokeless tobacco: Never Used  Substance Use Topics  . Alcohol use: No  . Drug use: No     Allergies   Patient has no known allergies.   Review of Systems Review of Systems  Constitutional: Negative for chills and fever.  HENT: Positive for congestion, ear pain, postnasal drip, rhinorrhea and sore throat. Negative for sinus pressure, sinus pain, trouble swallowing and voice change.   Respiratory: Positive for cough.  Negative for chest tightness and shortness of breath.   Cardiovascular: Positive for chest pain. Negative for palpitations.  Musculoskeletal: Positive for back pain. Negative for neck pain and neck stiffness.  Neurological: Negative for headaches.  All other systems reviewed and are negative.    Physical Exam Updated Vital Signs BP 103/78 (BP Location: Right Arm)   Pulse 100   Temp 99.3 F (37.4 C) (Oral)   Resp 18   Ht 5\' 3"  (1.6 m)   Wt 83.5 kg   SpO2 96%   BMI 32.59 kg/m   Physical Exam  Constitutional: She is oriented to person, place, and time. She appears well-developed and well-nourished. No distress.  HENT:  Head: Normocephalic and atraumatic.  Right Ear: Tympanic membrane, external ear and ear canal normal.  Left Ear: Tympanic membrane, external ear and ear canal normal.  Nose: Mucosal edema and rhinorrhea present.  Mouth/Throat: Uvula is midline, oropharynx is clear and moist and mucous membranes are normal. No oropharyngeal exudate. No tonsillar exudate.  Eyes: Conjunctivae are normal. No scleral icterus.  Neck: Normal range of motion. Neck supple.  Cardiovascular: Normal rate, regular rhythm and normal pulses.  No murmur heard. Pulmonary/Chest: Effort normal. No respiratory distress. She has no wheezes. She has rhonchi (Diffuse bilaterally).  Abdominal: Soft. Bowel sounds are normal. She exhibits no distension. There is no tenderness. There is no guarding.  Musculoskeletal: She exhibits no edema.  There is tenderness to palpation over the bilateral trapezius muscles.  Palpation over this area both re-creates and exacerbates patient's reported back pain.  There is tenderness to palpation over the bilateral anterior chest wall, palpation here both re-creates and exacerbates her reported chest pain.  Lymphadenopathy:    She has no cervical adenopathy.  Neurological: She is alert and oriented to person, place, and time.  Skin: Skin is warm and dry. She is not  diaphoretic.  Psychiatric: She has a normal mood and affect. Her behavior is normal.  Nursing note and vitals reviewed.    ED Treatments / Results  Labs (all labs ordered are listed, but only abnormal results are displayed) Labs Reviewed  BASIC METABOLIC PANEL - Abnormal; Notable for the following components:      Result Value   Potassium 3.4 (*)    Glucose, Bld 129 (*)    BUN 5 (*)    All other components within normal limits  CBC  I-STAT TROPONIN, ED  I-STAT BETA HCG BLOOD, ED ([facility], [facility], [facility] ONLY)    EKG EKG Interpretation  Date/Time:  Monday December 31 2017 20:40:17 EST Ventricular Rate:  105 PR Interval:  140 QRS Duration: 76 QT Interval:  326 QTC Calculation: 430 R Axis:   50 Text Interpretation:  duplicate, discard  Confirmed by Dione Booze ([id number]) on 01/01/2018 1:16:17 AM   Radiology Dg Chest 2 View  Result Date: 12/31/2017 CLINICAL DATA:  Cough for 2 days. Chest pain and pressure tonight. EXAM: CHEST - 2 VIEW COMPARISON:  12/31/2017 at 1943 hours FINDINGS: The cardiomediastinal silhouette is unchanged and within normal limits. Mild peribronchial thickening is unchanged. No airspace consolidation, overt pulmonary edema, pleural effusion, pneumothorax is identified. Right upper quadrant abdominal surgical clips are noted. No acute osseous abnormality is seen. IMPRESSION: Unchanged mild peribronchial thickening which may reflect bronchitis or asthma. No evidence of pneumonia. Electronically Signed   By: Sebastian Ache M.D.   On: 12/31/2017 21:34   Dg Chest 2 View  Result Date: 12/31/2017 CLINICAL DATA:  Two-day history of cough, chest pain and low-grade fever. EXAM: CHEST - 2 VIEW COMPARISON:  None. FINDINGS: Cardiomediastinal silhouette unremarkable. Mild central peribronchial thickening. Lungs otherwise clear. No localized airspace consolidation. No pleural effusions. No pneumothorax. Normal pulmonary vascularity. Visualized bony thorax intact. IMPRESSION: Mild changes of  bronchitis and/or asthma without focal airspace pneumonia. Electronically Signed   By: Hulan Saas M.D.   On: 12/31/2017 19:55    Procedures Procedures (including critical care time)  Medications Ordered in ED Medications  ipratropium-albuterol (DUONEB) 0.5-2.5 (3) MG/3ML nebulizer solution 3 mL (3 mLs Nebulization Given 01/01/18 0043)  albuterol (PROVENTIL HFA;VENTOLIN HFA) 108 (90 Base) MCG/ACT inhaler 2 puff (2 puffs Inhalation Given 01/01/18 0043)     Initial Impression / Assessment and Plan / ED Course  I have reviewed the triage vital signs and the nursing notes.  Pertinent labs & imaging results that were available during my care of the patient were reviewed by me and considered in my medical decision making (see chart for details).    Patient is to be discharged with recommendation to follow up with PCP in regards to today's hospital visit. Chest pain is not likely of cardiac or pulmonary etiology d/t presentation,  VSS, no tracheal deviation, no JVD or new murmur, RRR, breath sounds equal bilaterally, clear after treatment with DuoNeb and albuterol inhaler. EKG without acute abnormalities, negative troponin, and negative CXR.  Unable to apply PERC criteria as patient uses hormone based birth control.  She also has an IUD that is hormone based.  Discussed with patient the indication for d-dimer and further evaluation.  Patient refuses this and states her understanding of the risks and potential consequences of this decision.  Her chest and back pain is recreatable with palpation, suspect that this is primarily musculoskeletal pain from cough.  Pt has been advised to return to the ED if CP becomes exertional, associated with diaphoresis or nausea, radiates to left jaw/arm, worsens or becomes concerning in any way. Pt appears reliable for follow up and is agreeable to discharge.    Final Clinical Impressions(s) / ED Diagnoses   Final diagnoses:  Cough  Atypical chest pain  Upper  respiratory tract infection, unspecified type    ED Discharge Orders    None       Cristina Gong, Cordelia Poche 01/01/18 0146    Dione Booze, MD 01/01/18 [phone]

## 2018-01-01 NOTE — Discharge Instructions (Addendum)
Today you refused blood testing to evaluate for the possibility of a blood clot in your lungs.  Your treated with albuterol, after which your lungs improved.  You may use your albuterol by taking 2 puffs every 4-6 hours as needed for wheezing, cough, and shortness of breath.  Please take Ibuprofen (Advil, motrin) and Tylenol (acetaminophen) to relieve your pain.  You may take up to 600 MG (3 pills) of normal strength ibuprofen every 8 hours as needed.  In between doses of ibuprofen you make take tylenol, up to 1,000 mg (two extra strength pills).  Do not take more than 3,000 mg tylenol in a 24 hour period.  Please check all medication labels as many medications such as pain and cold medications may contain tylenol.  Do not drink alcohol while taking these medications.  Do not take other NSAID'S while taking ibuprofen (such as aleve or naproxen).  Please take ibuprofen with food to decrease stomach upset.

## 2018-01-02 ENCOUNTER — Ambulatory Visit: Payer: Self-pay | Admitting: Medical

## 2018-02-20 ENCOUNTER — Other Ambulatory Visit: Payer: Self-pay

## 2018-02-20 ENCOUNTER — Encounter (HOSPITAL_COMMUNITY): Payer: Self-pay | Admitting: Emergency Medicine

## 2018-02-20 ENCOUNTER — Emergency Department (HOSPITAL_COMMUNITY)
Admission: EM | Admit: 2018-02-20 | Discharge: 2018-02-21 | Disposition: A | Discharge status: Home / Self Care | Payer: BLUE CROSS/BLUE SHIELD | Attending: Emergency Medicine | Admitting: Emergency Medicine

## 2018-02-20 DIAGNOSIS — W108XXA Fall (on) (from) other stairs and steps, initial encounter: Secondary | ICD-10-CM | POA: Insufficient documentation

## 2018-02-20 DIAGNOSIS — Y999 Unspecified external cause status: Secondary | ICD-10-CM | POA: Insufficient documentation

## 2018-02-20 DIAGNOSIS — S2020XA Contusion of thorax, unspecified, initial encounter: Secondary | ICD-10-CM | POA: Insufficient documentation

## 2018-02-20 DIAGNOSIS — S40011A Contusion of right shoulder, initial encounter: Secondary | ICD-10-CM | POA: Insufficient documentation

## 2018-02-20 DIAGNOSIS — S40021A Contusion of right upper arm, initial encounter: Secondary | ICD-10-CM

## 2018-02-20 DIAGNOSIS — Y929 Unspecified place or not applicable: Secondary | ICD-10-CM | POA: Diagnosis not present

## 2018-02-20 DIAGNOSIS — Y939 Activity, unspecified: Secondary | ICD-10-CM | POA: Insufficient documentation

## 2018-02-20 MED ORDER — HYDROCODONE-ACETAMINOPHEN 5-325 MG PO TABS
1.0000 | ORAL_TABLET | Freq: Once | ORAL | Status: AC
  Administered 2018-02-21: 1 via ORAL
  Filled 2018-02-20: qty 1

## 2018-02-20 NOTE — ED Triage Notes (Signed)
Pt reports she had a fall down stair striking right arm, shoulder and head against wooden railing denies LOC.

## 2018-02-21 ENCOUNTER — Emergency Department (HOSPITAL_COMMUNITY): Payer: BLUE CROSS/BLUE SHIELD

## 2018-02-21 MED ORDER — IBUPROFEN 600 MG PO TABS: 600.0000 mg | ORAL_TABLET | Freq: Four times a day (QID) | ORAL | 0 refills | Status: DC | PRN

## 2018-02-21 MED ORDER — HYDROCODONE-ACETAMINOPHEN 5-325 MG PO TABS: 1.0000 | ORAL_TABLET | ORAL | 0 refills | Status: DC | PRN

## 2018-02-21 NOTE — ED Provider Notes (Signed)
[facility]-EMERGENCY DEPT Provider Note   CSN: [id number] Arrival date & time: 02/20/18  2205     History   Chief Complaint No chief complaint on file.   HPI Haley Moses is a 42 y.o. female.  Patient presents for evaluation of right shoulder and lateral chest discomfort after a fall prior to arrival. She lost her balance going down a set of carpeted stairs, hitting the right shoulder and chest on the railing. She hit her head but denies LOC, dizziness, headache, or neck pain. No nausea or vomiting. No SOB or significant pain with respirations. No abdominal pain.  The history is provided by the patient. No language interpreter was used.    Past Medical History:  Diagnosis Date  . Gestational diabetes    gestational  . Headache     Patient Active Problem List   Diagnosis Date Noted  . Symptomatic cholelithiasis 09/27/2015  . Acute calculous cholecystitis 09/27/2015  . Gestational diabetes mellitus, class A1 11/13/2014  . 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 Vaginal bleeding during pregnancy, antepartum 11/10/2014  . LGA (large for gestational age) fetus 11/09/2014  . H/O: cesarean section 11/09/2014  . Obesity 11/09/2014  . Placenta previa 11/09/2014  . Advanced maternal age in multigravida     Past Surgical History:  Procedure Laterality Date  . CESAREAN SECTION  2012, 2014    x 2  . CHOLECYSTECTOMY N/A 09/27/2015   Procedure: LAPAROSCOPIC CHOLECYSTECTOMY WITH INTRAOPERATIVE CHOLANGIOGRAM;  Surgeon: Avel Peace, MD;  Location: [facility];  Service: General;  Laterality: N/A;     OB History    Gravida  4   Para  2   Term  2   Preterm  0   AB  1   Living  2     SAB  1   TAB  0   Ectopic  0   Multiple  0   Live Births  2            Home Medications    Prior to Admission medications   Medication Sig Start Date End Date Taking? Authorizing Provider  ACCU-CHEK FASTCLIX LANCETS MISC 1 each by  Percutaneous route 4 (four) times daily. For testing 4 times daily. DX GDM O24.419 11/09/14   Adam Phenix, MD  albuterol [facility] HFA) 108 [phone] Base) MCG/ACT inhaler Inhale 1-2 puffs into the lungs every 6 (six) hours as needed for wheezing or shortness of breath. 12/31/17   Linus Mako B, NP  glucose blood (ACCU-CHEK AVIVA) test strip 1 each by Other route 4 (four) times daily. For testing 4 times daily, DX GDM O24.419 11/09/14   Adam Phenix, MD  ibuprofen (ADVIL,MOTRIN) 200 MG tablet Take 400 mg by mouth every 4 (four) hours as needed for fever, headache, mild pain, moderate pain or cramping.    [provider]  omeprazole (PRILOSEC) 20 MG capsule Take 1 capsule (20 mg total) by mouth daily. 12/31/17   Georgetta Haber, NP  ondansetron (ZOFRAN-ODT) 4 MG disintegrating tablet Take 1 tablet (4 mg total) by mouth every 8 (eight) hours as needed for nausea or vomiting. 12/31/17   Georgetta Haber, NP  oxyCODONE (OXY IR/ROXICODONE) 5 MG immediate release tablet Take 1-2 tablets (5-10 mg total) by mouth every 4 (four) hours as needed for moderate pain. Patient not taking: Reported on 12/31/2017 09/28/15   Avel Peace, MD    Family History Family History  Problem Relation Age of Onset  . Early  death Mother     Social History Social History   Tobacco Use  . Smoking status: Never Smoker  . Smokeless tobacco: Never Used  Substance Use Topics  . Alcohol use: No  . Drug use: No     Allergies   Patient has no known allergies.   Review of Systems Review of Systems  Constitutional: Negative for chills and fever.  Respiratory: Negative.  Negative for shortness of breath.   Cardiovascular: Positive for chest pain (Right lateral chest wall).  Gastrointestinal: Negative.  Negative for abdominal pain and nausea.  Musculoskeletal: Negative for neck pain.       See HPI.  Skin: Negative.  Negative for color change and wound.  Neurological: Negative.  Negative for headaches.      Physical Exam Updated Vital Signs BP (!) 116/92 (BP Location: Left Arm)   Pulse 97   Temp 97.7 F (36.5 C) (Oral)   Resp 18   SpO2 97%   Physical Exam Vitals signs and nursing note reviewed.  Constitutional:      Appearance: She is well-developed.  HENT:     Head: Normocephalic and atraumatic.  Neck:     Musculoskeletal: Normal range of motion and neck supple.     Comments: No midline cervical tenderness.  Cardiovascular:     Rate and Rhythm: Normal rate and regular rhythm.     Heart sounds: No murmur.  Pulmonary:     Effort: Pulmonary effort is normal.     Breath sounds: Normal breath sounds. No wheezing, rhonchi or rales.  Chest:     Chest wall: Tenderness (Right lateral, mid-axillary line chest wall tenderness. No swelling or redness.) present.  Abdominal:     General: Bowel sounds are normal.     Palpations: Abdomen is soft.     Tenderness: There is no abdominal tenderness. There is no guarding or rebound.  Musculoskeletal: Normal range of motion.     Comments: Right shoulder minimally tender over posterior aspect. She is moving the arm freely without limitation or observed discomfort.   Skin:    General: Skin is warm and dry.     Findings: No rash.  Neurological:     General: No focal deficit present.     Mental Status: She is alert and oriented to person, place, and time.      ED Treatments / Results  Labs (all labs ordered are listed, but only abnormal results are displayed) Labs Reviewed - No data to display  EKG None  Radiology Dg Ribs Unilateral W/chest Right  Result Date: 02/21/2018 CLINICAL DATA:  Recent fall on stairs with right-sided rib pain, initial encounter EXAM: RIGHT RIBS AND CHEST - 3+ VIEW COMPARISON:  12/31/2017 FINDINGS: No acute rib fractures are noted. No pneumothorax is seen. Cardiac shadow is within normal limits. No focal infiltrate is seen. No other focal abnormality is noted. IMPRESSION: No evidence of rib fractures. No  acute abnormality noted. Electronically Signed   By: Alcide CleverMark  Lukens M.D.   On: 02/21/2018 00:36    Procedures Procedures (including critical care time)  Medications Ordered in ED Medications  HYDROcodone-acetaminophen (NORCO/VICODIN) 5-325 MG per tablet 1 tablet (1 tablet Oral Given 02/21/18 0034)     Initial Impression / Assessment and Plan / ED Course  I have reviewed the triage vital signs and the nursing notes.  Pertinent labs & imaging results that were available during my care of the patient were reviewed by me and considered in my medical decision making (see chart  for details).     Patient to ED after fall while on stairs, hitting her right shoulder and chest wall on railing. No difficulty or pain with breathing. She hit her head but denies LOC, nausea or current headache.   Exam is reassuring. No evidence of significant injury. No bruising, swelling, or bony deformity. Imaging is negative. Shoulder visualized in CXR.   She can be discharged home with prn PCP follow up. Care instructions discussed.   Final Clinical Impressions(s) / ED Diagnoses   Final diagnoses:  None   1. Fall 2. Contusion right chest wall 3. Right shoulder contusion  ED Discharge Orders    None       Danne HarborUpstill, Jael Waldorf, PA-C 02/21/18 0226    Dione BoozeGlick, David, MD 02/21/18 2236

## 2021-12-26 ENCOUNTER — Other Ambulatory Visit: Payer: Self-pay | Admitting: Obstetrics & Gynecology

## 2021-12-26 DIAGNOSIS — Z1231 Encounter for screening mammogram for malignant neoplasm of breast: Secondary | ICD-10-CM

## 2022-01-31 ENCOUNTER — Inpatient Hospital Stay: Admission: RE | Admit: 2022-01-31 | Payer: No Typology Code available for payment source | Source: Ambulatory Visit

## 2022-09-05 ENCOUNTER — Other Ambulatory Visit: Payer: Self-pay

## 2022-09-05 ENCOUNTER — Encounter (HOSPITAL_BASED_OUTPATIENT_CLINIC_OR_DEPARTMENT_OTHER): Payer: Self-pay | Admitting: Pediatrics

## 2022-09-05 ENCOUNTER — Emergency Department (HOSPITAL_BASED_OUTPATIENT_CLINIC_OR_DEPARTMENT_OTHER)
Admission: EM | Admit: 2022-09-05 | Discharge: 2022-09-05 | Disposition: A | Discharge status: Home / Self Care | Payer: Medicaid Other | Attending: Emergency Medicine | Admitting: Emergency Medicine

## 2022-09-05 DIAGNOSIS — K0889 Other specified disorders of teeth and supporting structures: Secondary | ICD-10-CM | POA: Diagnosis present

## 2022-09-05 MED ORDER — PENICILLIN V POTASSIUM 500 MG PO TABS: 500.0000 mg | ORAL_TABLET | Freq: Four times a day (QID) | ORAL | 0 refills | Status: AC

## 2022-09-05 MED ORDER — IBUPROFEN 600 MG PO TABS: 600.0000 mg | ORAL_TABLET | Freq: Three times a day (TID) | ORAL | 0 refills | Status: DC | PRN

## 2022-09-05 NOTE — ED Triage Notes (Signed)
C/O right sided mouth / dental pain + swelling started yesterday, some relief from OTC meds.

## 2022-09-05 NOTE — ED Provider Notes (Signed)
[place] [facility] Provider Note   CSN: [id number] Arrival date & time: 09/05/22  4098     History  Chief Complaint  Patient presents with   Dental Pain    Haley Moses is a 47 y.o. female.  She was offered iPad interpreter and she declines.  Complaining of some left jaw and dental pain has been going on for a while but worse for the past few days.  She does not have insurance for dental at this time but is hoping to start it soon.  No fevers no difficulty swallowing.  No trauma.  Has taken Tylenol without improvement.  This has happened before.  The history is provided by the patient.  Dental Pain Location:  Lower Lower teeth location:  18/LL 2nd molar and 19/LL 1st molar Quality:  Aching Onset quality:  Gradual Progression:  Worsening Chronicity:  Recurrent Context: not trauma   Relieved by:  Nothing Ineffective treatments:  Acetaminophen Associated symptoms: no difficulty swallowing, no facial swelling, no fever and no oral bleeding   Risk factors: lack of dental care        Home Medications Prior to Admission medications   Medication Sig Start Date End Date Taking? Authorizing Provider  ACCU-CHEK FASTCLIX LANCETS MISC 1 each by Percutaneous route 4 (four) times daily. For testing 4 times daily. DX GDM O24.419 11/09/14   Adam Phenix, MD  albuterol [facility] HFA) [phone] Base) MCG/ACT inhaler Inhale 1-2 puffs into the lungs every 6 (six) hours as needed for wheezing or shortness of breath. 12/31/17   Linus Mako B, NP  glucose blood (ACCU-CHEK AVIVA) test strip 1 each by Other route 4 (four) times daily. For testing 4 times daily, DX GDM O24.419 11/09/14   Adam Phenix, MD  HYDROcodone-acetaminophen (NORCO/VICODIN) 5-325 MG tablet Take 1 tablet by mouth every 4 (four) hours as needed. 02/21/18   Elpidio Anis, PA-C  ibuprofen (ADVIL,MOTRIN) 600 MG tablet Take 1 tablet (600 mg total) by mouth every 6 (six) hours as needed.  02/21/18   Elpidio Anis, PA-C  omeprazole (PRILOSEC) 20 MG capsule Take 1 capsule (20 mg total) by mouth daily. 12/31/17   Georgetta Haber, NP  ondansetron (ZOFRAN-ODT) 4 MG disintegrating tablet Take 1 tablet (4 mg total) by mouth every 8 (eight) hours as needed for nausea or vomiting. 12/31/17   Georgetta Haber, NP  oxyCODONE (OXY IR/ROXICODONE) 5 MG immediate release tablet Take 1-2 tablets (5-10 mg total) by mouth every 4 (four) hours as needed for moderate pain. Patient not taking: Reported on 12/31/2017 09/28/15   Avel Peace, MD      Allergies    Patient has no known allergies.    Review of Systems   Review of Systems  Constitutional:  Negative for fever.  HENT:  Negative for facial swelling.     Physical Exam Updated Vital Signs BP 118/81 (BP Location: Left Arm)   Pulse 79   Temp 98.4 F (36.9 C)   Resp 17   Ht 5\' 4"  (1.626 m)   Wt 79.4 kg   LMP  (LMP Unknown)   SpO2 100%   BMI 30.04 kg/m  Physical Exam Constitutional:      Appearance: Normal appearance. She is well-developed.  HENT:     Head: Normocephalic and atraumatic.      Right Ear: Tympanic membrane, ear canal and external ear normal.     Left Ear: Tympanic membrane, ear canal and external ear normal.  Nose: Nose normal.     Mouth/Throat:     Mouth: Mucous membranes are moist.     Pharynx: Oropharynx is clear.  Eyes:     Conjunctiva/sclera: Conjunctivae normal.  Musculoskeletal:     Cervical back: Neck supple.  Skin:    General: Skin is warm and dry.  Neurological:     Mental Status: She is alert.     GCS: GCS eye subscore is 4. GCS verbal subscore is 5. GCS motor subscore is 6.     ED Results / Procedures / Treatments   Labs (all labs ordered are listed, but only abnormal results are displayed) Labs Reviewed - No data to display  EKG None  Radiology No results found.  Procedures Procedures    Medications Ordered in ED Medications - No data to display  ED Course/ Medical  Decision Making/ A&P                             Medical Decision Making Risk Prescription drug management.   This patient complains of left jaw pain; this involves an extensive number of treatment Options and is a complaint that carries with it a high risk of complications and morbidity. The differential includes gingivitis, dental infection, abscess's, impacted wisdom teeth Previous records obtained and reviewed in epic no recent admissions Social determinants considered, lack of dental insurance   After the interventions stated above, I reevaluated the patient and found patient to be nontoxic-appearing no distress  Admission and further testing considered, will cover with antibiotics and NSAIDs.  Resources given for outpatient dental.         Final Clinical Impression(s) / ED Diagnoses Final diagnoses:  Pain, dental    Rx / DC Orders ED Discharge Orders          Ordered    ibuprofen (ADVIL) 600 MG tablet  Every 8 hours PRN        09/05/22 0750    penicillin v potassium (VEETID) 500 MG tablet  4 times daily        09/05/22 0750              Terrilee Files, MD 09/05/22 [phone]

## 2022-09-16 ENCOUNTER — Encounter (HOSPITAL_BASED_OUTPATIENT_CLINIC_OR_DEPARTMENT_OTHER): Payer: Self-pay

## 2022-09-16 ENCOUNTER — Other Ambulatory Visit: Payer: Self-pay

## 2022-09-16 ENCOUNTER — Emergency Department (HOSPITAL_BASED_OUTPATIENT_CLINIC_OR_DEPARTMENT_OTHER)
Admission: EM | Admit: 2022-09-16 | Discharge: 2022-09-16 | Disposition: A | Discharge status: Home / Self Care | Payer: Medicaid Other | Attending: Emergency Medicine | Admitting: Emergency Medicine

## 2022-09-16 DIAGNOSIS — K0889 Other specified disorders of teeth and supporting structures: Secondary | ICD-10-CM | POA: Insufficient documentation

## 2022-09-16 MED ORDER — IBUPROFEN 800 MG PO TABS
800.0000 mg | ORAL_TABLET | Freq: Once | ORAL | Status: AC
  Administered 2022-09-16: 800 mg via ORAL
  Filled 2022-09-16: qty 1

## 2022-09-16 MED ORDER — PENICILLIN V POTASSIUM 250 MG PO TABS
500.0000 mg | ORAL_TABLET | Freq: Once | ORAL | Status: AC
  Administered 2022-09-16: 500 mg via ORAL
  Filled 2022-09-16: qty 2

## 2022-09-16 MED ORDER — IBUPROFEN 600 MG PO TABS: 600.0000 mg | ORAL_TABLET | Freq: Three times a day (TID) | ORAL | 2 refills | Status: AC | PRN

## 2022-09-16 MED ORDER — PENICILLIN V POTASSIUM 500 MG PO TABS: 500.0000 mg | ORAL_TABLET | Freq: Four times a day (QID) | ORAL | 0 refills | Status: AC

## 2022-09-16 NOTE — Discharge Instructions (Signed)
You need to see a dentist to evaluate your teeth.  We are not able to fix teeth in the emergency department.

## 2022-09-16 NOTE — ED Triage Notes (Signed)
Ongoing bilateral dental pain since 7/8. Was seen and received rx for abx and ibuprofen.   Saw dentist and was not in agreement of treatment. Seeking second opinion.   Out of ibuprofen and requesting medications for pain control.

## 2022-09-16 NOTE — ED Provider Notes (Signed)
[place] [facility] Provider Note   CSN: [id number] Arrival date & time: 09/16/22  0147     History  Chief Complaint  Patient presents with   Dental Pain    Haley Moses is a 47 y.o. female.  The history is provided by the patient.  Dental Pain She had been seen here for dental pain and received a prescription for antibiotics and ibuprofen.  She had some swelling at that time which has gone down, but states that she continues to have pain when her ibuprofen runs out.  She has no more ibuprofen and needs a refill on the prescription.  She has not seen a dentist.  She states that she has obtained dental insurance but there are still some issues regarding it and she is not sure if she can use it.  Of note, she states that her pain is in the molars of the upper teeth on both sides although the left is worse than the right.   Home Medications Prior to Admission medications   Medication Sig Start Date End Date Taking? Authorizing Provider  ACCU-CHEK FASTCLIX LANCETS MISC 1 each by Percutaneous route 4 (four) times daily. For testing 4 times daily. DX GDM O24.419 11/09/14   Adam Phenix, MD  albuterol [facility] HFA) 108 [phone] Base) MCG/ACT inhaler Inhale 1-2 puffs into the lungs every 6 (six) hours as needed for wheezing or shortness of breath. 12/31/17   Linus Mako B, NP  glucose blood (ACCU-CHEK AVIVA) test strip 1 each by Other route 4 (four) times daily. For testing 4 times daily, DX GDM O24.419 11/09/14   Adam Phenix, MD  HYDROcodone-acetaminophen (NORCO/VICODIN) 5-325 MG tablet Take 1 tablet by mouth every 4 (four) hours as needed. 02/21/18   Elpidio Anis, PA-C  ibuprofen (ADVIL) 600 MG tablet Take 1 tablet (600 mg total) by mouth every 8 (eight) hours as needed. 09/05/22   Terrilee Files, MD  omeprazole (PRILOSEC) 20 MG capsule Take 1 capsule (20 mg total) by mouth daily. 12/31/17   Georgetta Haber, NP  ondansetron (ZOFRAN-ODT) 4 MG  disintegrating tablet Take 1 tablet (4 mg total) by mouth every 8 (eight) hours as needed for nausea or vomiting. 12/31/17   Georgetta Haber, NP  oxyCODONE (OXY IR/ROXICODONE) 5 MG immediate release tablet Take 1-2 tablets (5-10 mg total) by mouth every 4 (four) hours as needed for moderate pain. Patient not taking: Reported on 12/31/2017 09/28/15   Avel Peace, MD      Allergies    Patient has no known allergies.    Review of Systems   Review of Systems  All other systems reviewed and are negative.   Physical Exam Updated Vital Signs BP (!) 140/81   Pulse 71   Temp 97.8 F (36.6 C) (Oral)   Resp 16   Ht 5\' 4"  (1.626 m)   Wt 74.8 kg   LMP  (LMP Unknown)   SpO2 100%   BMI 28.32 kg/m  Physical Exam Vitals and nursing note reviewed.   47 year old female, resting comfortably and in no acute distress. Vital signs are normal. Oxygen saturation is 100%, which is normal. Head is normocephalic and atraumatic. PERRLA, EOMI.  There is tenderness to percussion over teeth #2, 3, 14, 15.  Filling is noted in tooth #3, no obvious active caries noted.  There is no gingival swelling or erythema or pallor. Neck is nontender and supple without adenopathy. Lungs are clear without rales,  wheezes, or rhonchi. Chest is nontender. Heart has regular rate and rhythm without murmur. Skin is warm and dry without rash. Neurologic: Mental status is normal, cranial nerves are intact, moves all extremities equally.  ED Results / Procedures / Treatments    Procedures Procedures    Medications Ordered in ED Medications  penicillin v potassium (VEETID) tablet 500 mg (has no administration in time range)  ibuprofen (ADVIL) tablet 800 mg (has no administration in time range)    ED Course/ Medical Decision Making/ A&P                             Medical Decision Making  Dental pain without obvious dental caries or abscess to account for it.  I have reviewed her old records, and she had an ED  visit on 09/05/2022 at which time she was identified as having pain in teeth numbers 17 and 18.  I am discharging her with a new prescription for a 1 week supply of penicillin V potassium new prescription for ibuprofen.  I am referring her to the on-call dentist since it is not clear that her current dental insurance is active.  Final Clinical Impression(s) / ED Diagnoses Final diagnoses:  Pain, dental    Rx / DC Orders ED Discharge Orders          Ordered    penicillin v potassium (VEETID) 500 MG tablet  4 times daily        09/16/22 0431    ibuprofen (ADVIL) 600 MG tablet  Every 8 hours PRN        09/16/22 0431              Dione Booze, MD 09/16/22 [phone]
